# Patient Record
Sex: Male | Born: 1945 | Race: White | Hispanic: No | Marital: Married | Smoking: Former smoker
Health system: Southern US, Community
[De-identification: ages and names within clinical notes are randomized; demographics above are authoritative.]

## PROBLEM LIST (undated history)

## (undated) DIAGNOSIS — M1990 Unspecified osteoarthritis, unspecified site: Secondary | ICD-10-CM

## (undated) DIAGNOSIS — I10 Essential (primary) hypertension: Secondary | ICD-10-CM

## (undated) DIAGNOSIS — K219 Gastro-esophageal reflux disease without esophagitis: Secondary | ICD-10-CM

## (undated) DIAGNOSIS — E663 Overweight: Secondary | ICD-10-CM

## (undated) DIAGNOSIS — R079 Chest pain, unspecified: Principal | ICD-10-CM

## (undated) DIAGNOSIS — R410 Disorientation, unspecified: Secondary | ICD-10-CM

## (undated) DIAGNOSIS — R195 Other fecal abnormalities: Secondary | ICD-10-CM

## (undated) DIAGNOSIS — R0600 Dyspnea, unspecified: Secondary | ICD-10-CM

## (undated) DIAGNOSIS — E785 Hyperlipidemia, unspecified: Secondary | ICD-10-CM

## (undated) DIAGNOSIS — R519 Headache, unspecified: Secondary | ICD-10-CM

## (undated) HISTORY — DX: Overweight: E66.3

## (undated) HISTORY — DX: Disorientation, unspecified: R41.0

## (undated) HISTORY — DX: Chest pain, unspecified: R07.9

## (undated) HISTORY — PX: CYST EXCISION: SHX5701

## (undated) HISTORY — PX: OTHER SURGICAL HISTORY: SHX169

## (undated) HISTORY — DX: Essential (primary) hypertension: I10

## (undated) HISTORY — PX: TONSILLECTOMY: SUR1361

## (undated) HISTORY — DX: Hyperlipidemia, unspecified: E78.5

## (undated) HISTORY — DX: Gastro-esophageal reflux disease without esophagitis: K21.9

## (undated) HISTORY — DX: Other fecal abnormalities: R19.5

---

## 1975-06-25 HISTORY — PX: EXPLORATORY LAPAROTOMY: SUR591

## 2012-06-26 DIAGNOSIS — E785 Hyperlipidemia, unspecified: Secondary | ICD-10-CM | POA: Diagnosis not present

## 2012-06-26 DIAGNOSIS — G459 Transient cerebral ischemic attack, unspecified: Secondary | ICD-10-CM | POA: Diagnosis not present

## 2012-06-26 DIAGNOSIS — I10 Essential (primary) hypertension: Secondary | ICD-10-CM | POA: Diagnosis not present

## 2012-06-26 DIAGNOSIS — Z125 Encounter for screening for malignant neoplasm of prostate: Secondary | ICD-10-CM | POA: Diagnosis not present

## 2012-06-26 DIAGNOSIS — Z0000 Encounter for general adult medical examination without abnormal findings: Secondary | ICD-10-CM | POA: Diagnosis not present

## 2012-06-26 DIAGNOSIS — R7989 Other specified abnormal findings of blood chemistry: Secondary | ICD-10-CM | POA: Diagnosis not present

## 2012-07-01 DIAGNOSIS — Z0000 Encounter for general adult medical examination without abnormal findings: Secondary | ICD-10-CM | POA: Diagnosis not present

## 2012-07-06 ENCOUNTER — Encounter: Payer: Self-pay | Admitting: *Deleted

## 2012-07-07 ENCOUNTER — Ambulatory Visit (INDEPENDENT_AMBULATORY_CARE_PROVIDER_SITE_OTHER): Payer: Medicare Other | Admitting: Cardiology

## 2012-07-07 ENCOUNTER — Ambulatory Visit: Payer: Self-pay | Admitting: Cardiology

## 2012-07-07 ENCOUNTER — Encounter: Payer: Self-pay | Admitting: Cardiology

## 2012-07-07 ENCOUNTER — Encounter: Payer: Self-pay | Admitting: *Deleted

## 2012-07-07 VITALS — BP 128/88 | HR 74 | Ht 67.0 in | Wt 199.0 lb

## 2012-07-07 DIAGNOSIS — I10 Essential (primary) hypertension: Secondary | ICD-10-CM

## 2012-07-07 DIAGNOSIS — E785 Hyperlipidemia, unspecified: Secondary | ICD-10-CM | POA: Insufficient documentation

## 2012-07-07 DIAGNOSIS — R079 Chest pain, unspecified: Secondary | ICD-10-CM

## 2012-07-07 NOTE — Assessment & Plan Note (Addendum)
Moderate hyperlipidemia with other cardiovascular risk factors. Treatment with a statin is appropriate and is being monitored by PCP.

## 2012-07-07 NOTE — Assessment & Plan Note (Signed)
Chest discomfort is atypical, but patient has significant cardiovascular risk factors and wishes to become more physically active. Stress testing is clearly warranted in this setting.

## 2012-07-07 NOTE — Assessment & Plan Note (Addendum)
Blood pressure control has been fairly good, but is minimally elevated at this visit. Patient might benefit from further evaluation and treatment of possible sleep apnea. This issue will be left to PCP's discretion.

## 2012-07-07 NOTE — Patient Instructions (Addendum)
Your physician recommends that you schedule a follow-up appointment in: AS NEEDED  TREADMILL STRESS TEST
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
## 2012-07-07 NOTE — Progress Notes (Deleted)
Name: Joseph Dillon    DOB: Aug 23, 1945  Age: 67 y.o.  MR#: [id number]       PCP:  Frazier Richards, PA-C      Insurance: @PAYORNAME @   CC:   No chief complaint on file.  MEDICATION LIST - HAS NOT STARTED ON PRAVASTATIN.  AWAITING ON MAIL ORDER.  VS BP 128/88  Pulse 74  Ht 5\' 7"  (1.702 m)  Wt 199 lb (90.266 kg)  BMI 31.17 kg/m2  SpO2 98%  Weights Current Weight  07/07/12 199 lb (90.266 kg)    Blood Pressure  BP Readings from Last 3 Encounters:  07/07/12 128/88     Admit date:  (Not on file) Last encounter with RMR:  Visit date not found   Allergy Allergies  Allergen Reactions  . Lisinopril Cough  . Penicillins     Current Outpatient Prescriptions  Medication Sig Dispense Refill  . amLODipine (NORVASC) 10 MG tablet Take 10 mg by mouth daily.      . fish oil-omega-3 fatty acids 1000 MG capsule Take 2 g by mouth daily.      . hydrochlorothiazide (HYDRODIURIL) 25 MG tablet Take 25 mg by mouth daily.      Marland Kitchen losartan (COZAAR) 50 MG tablet Take 50 mg by mouth daily.      . magnesium oxide (MAG-OX) 400 MG tablet Take 400 mg by mouth daily.      . pravastatin (PRAVACHOL) 40 MG tablet Take 40 mg by mouth daily.        Discontinued Meds:   There are no discontinued medications.  There is no problem list on file for this patient.   LABS No results found for any previous visit.   Results for this Opt Visit:    No results found for this or any previous visit.  EKG No orders found for this or any previous visit.   Prior Assessment and Plan    Imaging: No results found.   FRS Calculation: Score not calculated. Missing: Total Cholesterol, HDL

## 2012-07-08 ENCOUNTER — Encounter: Payer: Self-pay | Admitting: Cardiology

## 2012-07-09 ENCOUNTER — Encounter (HOSPITAL_COMMUNITY): Payer: Self-pay | Admitting: Cardiology

## 2012-07-09 ENCOUNTER — Ambulatory Visit (HOSPITAL_COMMUNITY)
Admission: RE | Admit: 2012-07-09 | Discharge: 2012-07-09 | Disposition: A | Discharge status: Home / Self Care | Payer: Medicare Other | Source: Ambulatory Visit | Attending: Physician Assistant | Admitting: Physician Assistant

## 2012-07-09 DIAGNOSIS — R079 Chest pain, unspecified: Secondary | ICD-10-CM | POA: Diagnosis not present

## 2012-07-09 DIAGNOSIS — I10 Essential (primary) hypertension: Secondary | ICD-10-CM | POA: Diagnosis not present

## 2012-07-09 NOTE — Progress Notes (Addendum)
Stress Lab Nurses Notes - Joseph Dillon  Joseph Dillon 07/09/2012  Reason for doing test: Chest Pain  Type of test: Regular GTX  Nurse performing test: Joseph Clipper, RN  Nuclear Medicine Tech: Not Applicable  Echo Tech: Not Applicable  MD performing test: Joseph Reining NP  Family MD:   Test explained and consent signed: yes  IV started: No IV started  Symptoms: fatigue and sob  Treatment/Intervention: None  Reason test stopped: fatigue, reached target HR and SOB  After recovery IV was: No IV started  Patient to return to Nuc. Med at :N/A  Patient discharged: Home  Patient's Condition upon discharge was: stable  Comments: Patient walked 10.37 minutes. His resting Hr was 85 and His BP was 136/97 his peak Hr was 155 and peak BP was 186/97. Symptoms resolved in recovery.  Joseph Dillon  Graded Exercise Test-Interpretation      Graded exercise performed to a work load of 13.4 METs and a heart rate of 157, 100% of age-predicted maximum.  Exercise discontinued due to dyspnea and fatigue; no chest discomfort reported.  Blood pressure increased from a resting value of 135/95 to 170/100 at peak exercise.  No arrhythmias noted.  EKG: Normal sinus rhythm; left anterior fascicular block; delayed R-wave progression. Stress EKG:  No significant change Impression:  Negative and adequate graded exercise test.  Other findings as noted.  Joseph Dillon, M.D.

## 2012-07-16 ENCOUNTER — Telehealth: Payer: Self-pay | Admitting: *Deleted

## 2012-07-16 NOTE — Telephone Encounter (Signed)
Patient made aware of negative GXT results

## 2013-03-30 DIAGNOSIS — Z23 Encounter for immunization: Secondary | ICD-10-CM | POA: Diagnosis not present

## 2013-05-22 ENCOUNTER — Other Ambulatory Visit: Payer: Self-pay | Admitting: Family Medicine

## 2013-05-24 ENCOUNTER — Encounter: Payer: Self-pay | Admitting: Family Medicine

## 2013-05-24 NOTE — Telephone Encounter (Signed)
Medication refill for one time only.  Patient needs to be seen.  Letter sent for patient to call and schedule 

## 2013-08-12 ENCOUNTER — Other Ambulatory Visit: Payer: Medicare Other

## 2013-08-17 ENCOUNTER — Encounter: Payer: Self-pay | Admitting: Family Medicine

## 2013-08-24 ENCOUNTER — Other Ambulatory Visit: Payer: Medicare Other

## 2013-08-24 ENCOUNTER — Other Ambulatory Visit: Payer: Self-pay | Admitting: Family Medicine

## 2013-08-24 DIAGNOSIS — E785 Hyperlipidemia, unspecified: Secondary | ICD-10-CM | POA: Diagnosis not present

## 2013-08-24 DIAGNOSIS — Z79899 Other long term (current) drug therapy: Secondary | ICD-10-CM | POA: Diagnosis not present

## 2013-08-24 DIAGNOSIS — Z0000 Encounter for general adult medical examination without abnormal findings: Secondary | ICD-10-CM | POA: Diagnosis not present

## 2013-08-24 DIAGNOSIS — R7401 Elevation of levels of liver transaminase levels: Secondary | ICD-10-CM | POA: Diagnosis not present

## 2013-08-24 DIAGNOSIS — Z125 Encounter for screening for malignant neoplasm of prostate: Secondary | ICD-10-CM | POA: Diagnosis not present

## 2013-08-24 DIAGNOSIS — I10 Essential (primary) hypertension: Secondary | ICD-10-CM | POA: Diagnosis not present

## 2013-08-24 DIAGNOSIS — R7402 Elevation of levels of lactic acid dehydrogenase (LDH): Secondary | ICD-10-CM | POA: Diagnosis not present

## 2013-08-24 LAB — COMPLETE METABOLIC PANEL WITH GFR
ALT: 71 U/L — ABNORMAL HIGH (ref 0–53)
AST: 31 U/L (ref 0–37)
Albumin: 4.7 g/dL (ref 3.5–5.2)
Alkaline Phosphatase: 83 U/L (ref 39–117)
BUN: 21 mg/dL (ref 6–23)
CALCIUM: 10.6 mg/dL — AB (ref 8.4–10.5)
CHLORIDE: 97 meq/L (ref 96–112)
CO2: 30 mEq/L (ref 19–32)
Creat: 1.23 mg/dL (ref 0.50–1.35)
GFR, EST NON AFRICAN AMERICAN: 61 mL/min
GFR, Est African American: 70 mL/min
GLUCOSE: 102 mg/dL — AB (ref 70–99)
POTASSIUM: 4.3 meq/L (ref 3.5–5.3)
Sodium: 138 mEq/L (ref 135–145)
Total Bilirubin: 0.7 mg/dL (ref 0.2–1.2)
Total Protein: 7.4 g/dL (ref 6.0–8.3)

## 2013-08-24 LAB — CBC WITH DIFFERENTIAL/PLATELET
BASOS PCT: 0 % (ref 0–1)
Basophils Absolute: 0 10*3/uL (ref 0.0–0.1)
Eosinophils Absolute: 0.1 10*3/uL (ref 0.0–0.7)
Eosinophils Relative: 1 % (ref 0–5)
HEMATOCRIT: 45.1 % (ref 39.0–52.0)
HEMOGLOBIN: 16 g/dL (ref 13.0–17.0)
LYMPHS PCT: 36 % (ref 12–46)
Lymphs Abs: 2.8 10*3/uL (ref 0.7–4.0)
MCH: 30.1 pg (ref 26.0–34.0)
MCHC: 35.5 g/dL (ref 30.0–36.0)
MCV: 84.9 fL (ref 78.0–100.0)
MONO ABS: 0.5 10*3/uL (ref 0.1–1.0)
MONOS PCT: 6 % (ref 3–12)
Neutro Abs: 4.4 10*3/uL (ref 1.7–7.7)
Neutrophils Relative %: 57 % (ref 43–77)
Platelets: 257 10*3/uL (ref 150–400)
RBC: 5.31 MIL/uL (ref 4.22–5.81)
RDW: 14.2 % (ref 11.5–15.5)
WBC: 7.7 10*3/uL (ref 4.0–10.5)

## 2013-08-24 LAB — LIPID PANEL
CHOLESTEROL: 272 mg/dL — AB (ref 0–200)
HDL: 55 mg/dL (ref >39)
LDL Cholesterol: 176 mg/dL — ABNORMAL HIGH (ref 0–99)
Total CHOL/HDL Ratio: 4.9 Ratio
Triglycerides: 204 mg/dL — ABNORMAL HIGH (ref <150)
VLDL: 41 mg/dL — ABNORMAL HIGH (ref 0–40)

## 2013-08-24 LAB — TSH: TSH: 1.779 u[IU]/mL (ref 0.350–4.500)

## 2013-08-30 ENCOUNTER — Other Ambulatory Visit: Payer: Self-pay | Admitting: Physician Assistant

## 2013-08-30 DIAGNOSIS — Z1211 Encounter for screening for malignant neoplasm of colon: Secondary | ICD-10-CM

## 2013-08-31 ENCOUNTER — Ambulatory Visit (INDEPENDENT_AMBULATORY_CARE_PROVIDER_SITE_OTHER): Payer: Medicare Other | Admitting: Family Medicine

## 2013-08-31 ENCOUNTER — Encounter: Payer: Self-pay | Admitting: Family Medicine

## 2013-08-31 VITALS — BP 130/70 | HR 78 | Temp 98.8°F | Resp 16 | Ht 67.0 in | Wt 211.0 lb

## 2013-08-31 DIAGNOSIS — Z0000 Encounter for general adult medical examination without abnormal findings: Secondary | ICD-10-CM

## 2013-08-31 DIAGNOSIS — R7989 Other specified abnormal findings of blood chemistry: Secondary | ICD-10-CM

## 2013-08-31 DIAGNOSIS — R945 Abnormal results of liver function studies: Secondary | ICD-10-CM

## 2013-08-31 DIAGNOSIS — R053 Chronic cough: Secondary | ICD-10-CM

## 2013-08-31 DIAGNOSIS — Z125 Encounter for screening for malignant neoplasm of prostate: Secondary | ICD-10-CM

## 2013-08-31 DIAGNOSIS — E669 Obesity, unspecified: Secondary | ICD-10-CM | POA: Diagnosis not present

## 2013-08-31 DIAGNOSIS — R059 Cough, unspecified: Secondary | ICD-10-CM

## 2013-08-31 DIAGNOSIS — Z23 Encounter for immunization: Secondary | ICD-10-CM

## 2013-08-31 DIAGNOSIS — R05 Cough: Secondary | ICD-10-CM | POA: Diagnosis not present

## 2013-08-31 LAB — HEPATITIS PANEL, ACUTE
HCV AB: NEGATIVE
HEP B C IGM: NONREACTIVE
Hep A IgM: NONREACTIVE
Hepatitis B Surface Ag: NEGATIVE

## 2013-08-31 LAB — PSA, MEDICARE: PSA: 2.67 ng/mL (ref <=4.00)

## 2013-08-31 MED ORDER — DOXAZOSIN MESYLATE 4 MG PO TABS: 4.0000 mg | ORAL_TABLET | Freq: Every day | ORAL | Status: DC

## 2013-08-31 MED ORDER — TOPIRAMATE 25 MG PO TABS: 25.0000 mg | ORAL_TABLET | Freq: Two times a day (BID) | ORAL | Status: DC

## 2013-08-31 MED ORDER — OMEPRAZOLE 40 MG PO CPDR: 40.0000 mg | DELAYED_RELEASE_CAPSULE | Freq: Every day | ORAL | Status: DC
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
## 2013-08-31 NOTE — Addendum Note (Signed)
Addended by: Shary Decamp B on: 08/31/2013 10:47 AM   Modules accepted: Orders

## 2013-08-31 NOTE — Addendum Note (Signed)
Addended by: WRAY, [place] on: 08/31/2013 10:05 AM   Modules accepted: Orders

## 2013-09-03 ENCOUNTER — Telehealth: Payer: Self-pay | Admitting: *Deleted

## 2013-09-03 DIAGNOSIS — E669 Obesity, unspecified: Secondary | ICD-10-CM

## 2013-09-03 MED ORDER — ATORVASTATIN CALCIUM 10 MG PO TABS: 10.0000 mg | ORAL_TABLET | Freq: Every day | ORAL | Status: DC

## 2013-09-03 MED ORDER — TOPIRAMATE 25 MG PO TABS: 25.0000 mg | ORAL_TABLET | Freq: Two times a day (BID) | ORAL | Status: DC

## 2013-09-03 NOTE — Telephone Encounter (Signed)
Call placed to patient and patient made aware.   Prescription sent to pharmacy.  

## 2013-09-03 NOTE — Telephone Encounter (Signed)
Message copied by Sheral Flow on Fri Sep 03, 2013  9:05 AM ------      Message from: Jenna Luo      Created: Thu Sep 02, 2013  7:23 AM       There is no sign of viral hepatitis. He most likely has fatty liver disease. Okay with him starting Topamax. I also recommend Lipitor 10 mg by mouth daily for his elevated cholesterol. Recheck labs in 3 months. ------

## 2013-09-13 ENCOUNTER — Telehealth: Payer: Self-pay | Admitting: Physician Assistant

## 2013-09-13 MED ORDER — HYDROCHLOROTHIAZIDE 25 MG PO TABS: 25.0000 mg | ORAL_TABLET | Freq: Every day | ORAL | Status: DC

## 2013-09-13 MED ORDER — LOSARTAN POTASSIUM 50 MG PO TABS: 50.0000 mg | ORAL_TABLET | Freq: Every day | ORAL | Status: DC

## 2013-09-13 MED ORDER — DOXAZOSIN MESYLATE 4 MG PO TABS: 4.0000 mg | ORAL_TABLET | Freq: Every day | ORAL | Status: DC

## 2013-09-13 NOTE — Telephone Encounter (Signed)
Call back number is [phone] [facility] Pt is needing a refill on 3 bp medications.

## 2013-09-13 NOTE — Telephone Encounter (Signed)
Medication refilled per protocol. 

## 2013-09-19 ENCOUNTER — Other Ambulatory Visit: Payer: Self-pay | Admitting: Physician Assistant

## 2013-09-20 NOTE — Telephone Encounter (Signed)
Medication refilled per protocol. 

## 2013-10-06 ENCOUNTER — Telehealth: Payer: Self-pay

## 2013-10-06 NOTE — Telephone Encounter (Signed)
LMOM to call back

## 2013-10-06 NOTE — Telephone Encounter (Signed)
Gastroenterology Pre-Procedure Review  Request Date: Requesting Physician: PICKARD  PATIENT REVIEW QUESTIONS: The patient responded to the following health history questions as indicated:    1. Diabetes Melitis:  2. Joint replacements in the past 12 months:  3. Major health problems in the past 3 months:  4. Has an artificial valve or MVP:  5. Has a defibrillator:  6. Has been advised in past to take antibiotics in advance of a procedure like teeth cleaning:  7. Acholic: 8 Family history colon cancer:    MEDICATIONS & ALLERGIES:    Patient reports the following regarding taking any blood thinners:   Plavix?  Aspirin?  Coumadin?   Patient confirms/reports the following medications:  Current Outpatient Prescriptions  Medication Sig Dispense Refill  . amLODipine (NORVASC) 10 MG tablet Take 1 tablet (10 mg total) by mouth daily.  90 tablet  1  . aspirin 325 MG tablet Take 325 mg by mouth daily.      Marland Kitchen atorvastatin (LIPITOR) 10 MG tablet Take 1 tablet (10 mg total) by mouth daily.  90 tablet  3  . doxazosin (CARDURA) 4 MG tablet Take 1 tablet (4 mg total) by mouth daily.  90 tablet  1  . hydrochlorothiazide (HYDRODIURIL) 25 MG tablet Take 1 tablet (25 mg total) by mouth daily.  90 tablet  1  . losartan (COZAAR) 50 MG tablet Take 1 tablet (50 mg total) by mouth daily.  90 tablet  1  . omeprazole (PRILOSEC) 40 MG capsule Take 1 capsule (40 mg total) by mouth daily.  30 capsule  3  . topiramate (TOPAMAX) 25 MG tablet Take 1 tablet (25 mg total) by mouth 2 (two) times daily.  180 tablet  0   No current facility-administered medications for this visit.    Patient confirms/reports the following allergies:  Allergies  Allergen Reactions  . Lisinopril Cough  . Penicillins     No orders of the defined types were placed in this encounter.    AUTHORIZATION INFORMATION Primary Insurance: ,  ID #:   Group #:  Pre-Cert / Auth required:  Pre-Cert / Auth #:   Secondary Insurance:   ID  #:   Group #:  Pre-Cert / Auth required:  Pre-Cert / Auth #:   SCHEDULE INFORMATION: Procedure has been scheduled as follows:  Date:  Time:  Location:  This Gastroenterology Pre-Precedure Review Form is being routed to the following provider(s):

## 2013-11-17 NOTE — Telephone Encounter (Signed)
LMOM to call.

## 2013-12-30 ENCOUNTER — Telehealth: Payer: Self-pay

## 2013-12-30 NOTE — Telephone Encounter (Signed)
Pt was referred by [facility] for screening colonoscopy. Called and [facility] for a return call.

## 2014-01-25 NOTE — Telephone Encounter (Signed)
LMOM for a return call.  

## 2014-02-11 ENCOUNTER — Other Ambulatory Visit: Payer: Self-pay | Admitting: Family Medicine

## 2014-02-11 NOTE — Telephone Encounter (Signed)
Refill appropriate and filled per protocol. 

## 2014-02-25 ENCOUNTER — Other Ambulatory Visit: Payer: Self-pay | Admitting: Family Medicine

## 2014-02-27 ENCOUNTER — Other Ambulatory Visit: Payer: Self-pay | Admitting: Family Medicine

## 2014-04-05 DIAGNOSIS — Z23 Encounter for immunization: Secondary | ICD-10-CM | POA: Diagnosis not present

## 2014-04-24 ENCOUNTER — Other Ambulatory Visit: Payer: Self-pay | Admitting: Family Medicine

## 2014-04-25 NOTE — Telephone Encounter (Signed)
Medication filled x1 with no refills.   Requires office visit before any further refills can be given.   Letter sent.  

## 2014-05-08 ENCOUNTER — Other Ambulatory Visit: Payer: Self-pay | Admitting: Family Medicine

## 2014-05-12 ENCOUNTER — Encounter: Payer: Self-pay | Admitting: Family Medicine

## 2014-05-12 ENCOUNTER — Other Ambulatory Visit: Payer: Self-pay | Admitting: Family Medicine

## 2014-05-12 NOTE — Telephone Encounter (Signed)
will send letter for ov and labs

## 2014-06-28 ENCOUNTER — Encounter: Payer: Self-pay | Admitting: Family Medicine

## 2014-06-28 ENCOUNTER — Ambulatory Visit (INDEPENDENT_AMBULATORY_CARE_PROVIDER_SITE_OTHER): Payer: Medicare Other | Admitting: Family Medicine

## 2014-06-28 VITALS — BP 118/70 | HR 72 | Temp 98.3°F | Resp 18 | Wt 206.0 lb

## 2014-06-28 DIAGNOSIS — I10 Essential (primary) hypertension: Secondary | ICD-10-CM

## 2014-06-28 LAB — COMPLETE METABOLIC PANEL WITH GFR
ALBUMIN: 4.3 g/dL (ref 3.5–5.2)
ALK PHOS: 75 U/L (ref 39–117)
ALT: 30 U/L (ref 0–53)
AST: 24 U/L (ref 0–37)
BUN: 20 mg/dL (ref 6–23)
CO2: 30 mEq/L (ref 19–32)
Calcium: 9.4 mg/dL (ref 8.4–10.5)
Chloride: 99 mEq/L (ref 96–112)
Creat: 1.18 mg/dL (ref 0.50–1.35)
GFR, EST AFRICAN AMERICAN: 74 mL/min
GFR, EST NON AFRICAN AMERICAN: 64 mL/min
Glucose, Bld: 103 mg/dL — ABNORMAL HIGH (ref 70–99)
POTASSIUM: 4.9 meq/L (ref 3.5–5.3)
SODIUM: 140 meq/L (ref 135–145)
TOTAL PROTEIN: 6.6 g/dL (ref 6.0–8.3)
Total Bilirubin: 0.6 mg/dL (ref 0.2–1.2)

## 2014-06-28 LAB — LIPID PANEL
CHOLESTEROL: 244 mg/dL — AB (ref 0–200)
HDL: 49 mg/dL (ref >39)
LDL Cholesterol: 172 mg/dL — ABNORMAL HIGH (ref 0–99)
TRIGLYCERIDES: 116 mg/dL (ref <150)
Total CHOL/HDL Ratio: 5 Ratio
VLDL: 23 mg/dL (ref 0–40)
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
## 2014-07-06 ENCOUNTER — Other Ambulatory Visit: Payer: Self-pay | Admitting: Family Medicine

## 2014-07-06 NOTE — Telephone Encounter (Signed)
Medication refilled per protocol. 

## 2014-07-12 ENCOUNTER — Telehealth: Payer: Self-pay | Admitting: Family Medicine

## 2014-07-12 MED ORDER — PRAVASTATIN SODIUM 40 MG PO TABS: 40.0000 mg | ORAL_TABLET | Freq: Every day | ORAL | Status: DC

## 2014-07-12 NOTE — Telephone Encounter (Signed)
-----   Message from Susy Frizzle, MD sent at 06/30/2014  7:27 AM EST ----- ldl is bad. Start lipitor or pravastatin 40 mg poqday and recheck in 3 months.

## 2014-07-12 NOTE — Telephone Encounter (Signed)
Spoke to wife about lab results. RX to pharmacy.  Needs three month follow up

## 2014-07-23 ENCOUNTER — Other Ambulatory Visit: Payer: Self-pay | Admitting: Family Medicine

## 2014-08-30 ENCOUNTER — Encounter: Payer: Self-pay | Admitting: Family Medicine

## 2014-08-30 ENCOUNTER — Ambulatory Visit (INDEPENDENT_AMBULATORY_CARE_PROVIDER_SITE_OTHER): Payer: BLUE CROSS/BLUE SHIELD | Admitting: Family Medicine

## 2014-08-30 VITALS — BP 122/74 | HR 62 | Temp 98.2°F | Resp 16 | Ht 67.0 in | Wt 217.0 lb

## 2014-08-30 DIAGNOSIS — K219 Gastro-esophageal reflux disease without esophagitis: Secondary | ICD-10-CM | POA: Diagnosis not present

## 2014-08-30 DIAGNOSIS — Z125 Encounter for screening for malignant neoplasm of prostate: Secondary | ICD-10-CM

## 2014-08-30 DIAGNOSIS — E785 Hyperlipidemia, unspecified: Secondary | ICD-10-CM

## 2014-08-30 DIAGNOSIS — Z0000 Encounter for general adult medical examination without abnormal findings: Secondary | ICD-10-CM

## 2014-08-30 MED ORDER — PANTOPRAZOLE SODIUM 40 MG PO TBEC: 40.0000 mg | DELAYED_RELEASE_TABLET | Freq: Every day | ORAL | Status: DC
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
## 2014-09-15 ENCOUNTER — Other Ambulatory Visit: Payer: Self-pay | Admitting: Family Medicine

## 2014-09-15 NOTE — Telephone Encounter (Signed)
Refill appropriate and filled per protocol. 

## 2014-09-20 ENCOUNTER — Other Ambulatory Visit: Payer: Self-pay | Admitting: Family Medicine

## 2014-09-20 ENCOUNTER — Encounter: Payer: Self-pay | Admitting: Family Medicine

## 2014-09-20 NOTE — Telephone Encounter (Signed)
Medication refill for one time only.  Patient needs to be seen.  Letter sent for patient to call and schedule 

## 2014-09-30 ENCOUNTER — Other Ambulatory Visit: Payer: Self-pay | Admitting: Family Medicine

## 2014-10-12 ENCOUNTER — Other Ambulatory Visit: Payer: Medicare Other

## 2014-10-18 ENCOUNTER — Other Ambulatory Visit: Payer: Self-pay | Admitting: Family Medicine

## 2014-10-18 ENCOUNTER — Other Ambulatory Visit: Payer: Medicare Other

## 2014-10-18 ENCOUNTER — Ambulatory Visit: Payer: Medicare Other | Admitting: Family Medicine

## 2014-10-18 DIAGNOSIS — Z0000 Encounter for general adult medical examination without abnormal findings: Secondary | ICD-10-CM | POA: Diagnosis not present

## 2014-10-18 DIAGNOSIS — E785 Hyperlipidemia, unspecified: Secondary | ICD-10-CM | POA: Diagnosis not present

## 2014-10-19 LAB — COMPLETE METABOLIC PANEL WITH GFR
ALT: 31 U/L (ref 0–53)
AST: 21 U/L (ref 0–37)
Albumin: 4.5 g/dL (ref 3.5–5.2)
Alkaline Phosphatase: 64 U/L (ref 39–117)
BUN: 23 mg/dL (ref 6–23)
CHLORIDE: 103 meq/L (ref 96–112)
CO2: 26 meq/L (ref 19–32)
Calcium: 9.3 mg/dL (ref 8.4–10.5)
Creat: 1.24 mg/dL (ref 0.50–1.35)
GFR, EST AFRICAN AMERICAN: 69 mL/min
GFR, Est Non African American: 60 mL/min
Glucose, Bld: 102 mg/dL — ABNORMAL HIGH (ref 70–99)
POTASSIUM: 4.3 meq/L (ref 3.5–5.3)
SODIUM: 140 meq/L (ref 135–145)
TOTAL PROTEIN: 6.6 g/dL (ref 6.0–8.3)
Total Bilirubin: 0.6 mg/dL (ref 0.2–1.2)

## 2014-10-19 LAB — CBC WITH DIFFERENTIAL/PLATELET
BASOS ABS: 0 10*3/uL (ref 0.0–0.1)
Basophils Relative: 0 % (ref 0–1)
EOS PCT: 1 % (ref 0–5)
Eosinophils Absolute: 0.1 10*3/uL (ref 0.0–0.7)
HEMATOCRIT: 42.6 % (ref 39.0–52.0)
Hemoglobin: 14.5 g/dL (ref 13.0–17.0)
Lymphocytes Relative: 35 % (ref 12–46)
Lymphs Abs: 2.1 10*3/uL (ref 0.7–4.0)
MCH: 29.8 pg (ref 26.0–34.0)
MCHC: 34 g/dL (ref 30.0–36.0)
MCV: 87.7 fL (ref 78.0–100.0)
MPV: 10 fL (ref 8.6–12.4)
Monocytes Absolute: 0.4 10*3/uL (ref 0.1–1.0)
Monocytes Relative: 7 % (ref 3–12)
Neutro Abs: 3.5 10*3/uL (ref 1.7–7.7)
Neutrophils Relative %: 57 % (ref 43–77)
Platelets: 237 10*3/uL (ref 150–400)
RBC: 4.86 MIL/uL (ref 4.22–5.81)
RDW: 14 % (ref 11.5–15.5)
WBC: 6.1 10*3/uL (ref 4.0–10.5)

## 2014-10-19 LAB — LIPID PANEL
Cholesterol: 207 mg/dL — ABNORMAL HIGH (ref 0–200)
HDL: 63 mg/dL (ref >=40)
LDL Cholesterol: 126 mg/dL — ABNORMAL HIGH (ref 0–99)
TRIGLYCERIDES: 89 mg/dL (ref <150)
Total CHOL/HDL Ratio: 3.3 Ratio
VLDL: 18 mg/dL (ref 0–40)

## 2014-10-19 LAB — PSA, MEDICARE: PSA: 2.83 ng/mL (ref <=4.00)

## 2014-10-25 ENCOUNTER — Ambulatory Visit (INDEPENDENT_AMBULATORY_CARE_PROVIDER_SITE_OTHER): Payer: BLUE CROSS/BLUE SHIELD | Admitting: Family Medicine

## 2014-10-25 ENCOUNTER — Encounter: Payer: Self-pay | Admitting: Family Medicine

## 2014-10-25 VITALS — BP 98/62 | HR 78 | Temp 98.8°F | Resp 16 | Ht 67.0 in | Wt 206.0 lb

## 2014-10-25 DIAGNOSIS — E785 Hyperlipidemia, unspecified: Secondary | ICD-10-CM

## 2014-10-25 DIAGNOSIS — I10 Essential (primary) hypertension: Secondary | ICD-10-CM

## 2014-10-25 DIAGNOSIS — Z23 Encounter for immunization: Secondary | ICD-10-CM

## 2014-10-25 NOTE — Addendum Note (Signed)
Addended by: Shary Decamp B on: 10/25/2014 10:06 AM   Modules accepted: Orders
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
## 2014-10-28 ENCOUNTER — Other Ambulatory Visit: Payer: Self-pay | Admitting: Family Medicine

## 2014-10-28 MED ORDER — PRAVASTATIN SODIUM 40 MG PO TABS: 40.0000 mg | ORAL_TABLET | Freq: Every day | ORAL | Status: DC

## 2014-10-28 NOTE — Telephone Encounter (Signed)
Pravastatin refilled for 6 months at pharmacy of choice

## 2014-11-02 ENCOUNTER — Telehealth: Payer: Self-pay | Admitting: Physician Assistant

## 2014-11-02 NOTE — Telephone Encounter (Signed)
[phone] ( he can't be reached he said because he won't get home till after 8) Pt called and said that the cholesterol medication he was given by another provider has made him break out in rash/hives and since then he has stopped taking it.

## 2014-11-03 NOTE — Telephone Encounter (Signed)
lmtrc

## 2014-11-03 NOTE — Telephone Encounter (Signed)
I would like him to stop pravastatin and replace with lipitor 20 mg poqday

## 2014-11-17 NOTE — Telephone Encounter (Signed)
LMTRC

## 2014-11-22 ENCOUNTER — Other Ambulatory Visit: Payer: Medicare Other

## 2014-11-22 ENCOUNTER — Ambulatory Visit: Payer: Medicare Other | Admitting: *Deleted

## 2014-11-22 DIAGNOSIS — W57XXXA Bitten or stung by nonvenomous insect and other nonvenomous arthropods, initial encounter: Principal | ICD-10-CM

## 2014-11-22 DIAGNOSIS — S50861A Insect bite (nonvenomous) of right forearm, initial encounter: Secondary | ICD-10-CM

## 2014-11-22 NOTE — Progress Notes (Signed)
[name], male   DOB: 1945/09/16, 69 y.o.   MRN: [id number] Pt came in for nurse visit to have someone to look at his tick bite that happened 4 weeks ago says he was fine and now he is developing fatique and headache with a rash on his upper extremities. I offered appt to pt for today and states that he has to be at work and could not be seen, then I offered 9:30 am with [employer] and she states at 930am he will be at work and had requested to be seen first thing in the am. I informed pt this 930 am is the earliest I can schedule him with this provider. He was firm and stated that he needed to be seen because his symptoms are getting worse and would like to just get an antibiotic. I went ahead and double booked for 8am with MaryBeth to please pt.

## 2014-11-23 ENCOUNTER — Ambulatory Visit (INDEPENDENT_AMBULATORY_CARE_PROVIDER_SITE_OTHER): Payer: BLUE CROSS/BLUE SHIELD | Admitting: Physician Assistant

## 2014-11-23 ENCOUNTER — Encounter: Payer: Self-pay | Admitting: Physician Assistant

## 2014-11-23 VITALS — BP 120/80 | HR 74 | Temp 98.4°F | Resp 19 | Wt 205.0 lb

## 2014-11-23 DIAGNOSIS — R5383 Other fatigue: Secondary | ICD-10-CM | POA: Diagnosis not present

## 2014-11-23 DIAGNOSIS — R519 Headache, unspecified: Secondary | ICD-10-CM

## 2014-11-23 DIAGNOSIS — T148 Other injury of unspecified body region: Secondary | ICD-10-CM

## 2014-11-23 DIAGNOSIS — R51 Headache: Secondary | ICD-10-CM | POA: Diagnosis not present

## 2014-11-23 DIAGNOSIS — W57XXXA Bitten or stung by nonvenomous insect and other nonvenomous arthropods, initial encounter: Secondary | ICD-10-CM

## 2014-11-23 LAB — FECAL OCCULT BLOOD, IMMUNOCHEMICAL: FECAL OCCULT BLOOD: POSITIVE — AB

## 2014-11-23 MED ORDER — DOXYCYCLINE HYCLATE 100 MG PO TABS: 100.0000 mg | ORAL_TABLET | Freq: Two times a day (BID) | ORAL | Status: DC
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
## 2014-11-24 ENCOUNTER — Telehealth: Payer: Self-pay | Admitting: Physician Assistant

## 2014-11-24 NOTE — Telephone Encounter (Signed)
If pt calls back there is lab results and a note in my box about his cholesterol medication as well

## 2014-11-24 NOTE — Telephone Encounter (Signed)
Patient calling regarding the doxycycline - too expensive, wants to try something else [phone]

## 2014-11-24 NOTE — Telephone Encounter (Signed)
Call placed to patient. LMTRC.  

## 2014-11-25 MED ORDER — DOXAZOSIN MESYLATE 4 MG PO TABS: ORAL_TABLET | ORAL | Status: DC

## 2014-11-25 NOTE — Telephone Encounter (Signed)
There is no alternative to doxycycline for tick borne illness that would be safe or cheaper.

## 2014-11-25 NOTE — Telephone Encounter (Signed)
Left voice message statng the message below.  cardura refilled

## 2014-11-25 NOTE — Telephone Encounter (Signed)
Pt wants to know if you could give him an alternative antibiotic that would be cheaper for him.MD please advse!  Pt also wants refill on Cardura.

## 2014-12-01 NOTE — Telephone Encounter (Signed)
Call placed to patient. LMTRC.  

## 2014-12-07 ENCOUNTER — Telehealth: Payer: Self-pay | Admitting: Physician Assistant

## 2014-12-07 NOTE — Telephone Encounter (Signed)
This was refilled on 11/25/14 at [facility]

## 2014-12-07 NOTE — Telephone Encounter (Signed)
[facility] [place] patient is calling to get refill on his doxazosin he has never had it filled at [facility]

## 2014-12-22 ENCOUNTER — Telehealth: Payer: Self-pay | Admitting: Physician Assistant

## 2014-12-22 MED ORDER — LOSARTAN POTASSIUM 50 MG PO TABS: 50.0000 mg | ORAL_TABLET | Freq: Every day | ORAL | Status: DC

## 2014-12-22 MED ORDER — AMLODIPINE BESYLATE 10 MG PO TABS: 10.0000 mg | ORAL_TABLET | Freq: Every day | ORAL | Status: DC

## 2014-12-22 NOTE — Telephone Encounter (Signed)
Medication refilled per protocol. 

## 2014-12-22 NOTE — Telephone Encounter (Signed)
[place] is needing refills on losartan (COZAAR) 50 MG tablet and amLODipine (NORVASC) 10 MG tablet

## 2015-04-19 DIAGNOSIS — Z23 Encounter for immunization: Secondary | ICD-10-CM | POA: Diagnosis not present

## 2015-04-24 ENCOUNTER — Telehealth: Payer: Self-pay | Admitting: Physician Assistant

## 2015-04-24 MED ORDER — HYDROCHLOROTHIAZIDE 25 MG PO TABS
ORAL_TABLET | ORAL | Status: DC
Start: 2015-04-24 — End: 2015-09-26

## 2015-04-24 NOTE — Telephone Encounter (Signed)
PHARMACY: [facility] [place]   MEDICATION: HYDROCHLOROTHIAZIDE   QTY:    SIG:    PHYSICIAN: DIXON   PT. PHONE #: [phone]

## 2015-04-24 NOTE — Telephone Encounter (Signed)
Medication refilled per protocol. 

## 2015-06-28 ENCOUNTER — Telehealth: Payer: Self-pay | Admitting: *Deleted

## 2015-06-28 MED ORDER — AMLODIPINE BESYLATE 10 MG PO TABS: 10.0000 mg | ORAL_TABLET | Freq: Every day | ORAL | Status: DC

## 2015-06-28 NOTE — Telephone Encounter (Signed)
Pt called need refills on Amlodipine, pt has changed pharmacy to [place] [facility]   Call back [phone]

## 2015-06-28 NOTE — Telephone Encounter (Signed)
Medication refilled per protocol. 

## 2015-07-07 ENCOUNTER — Other Ambulatory Visit: Payer: Self-pay | Admitting: Family Medicine

## 2015-07-07 MED ORDER — LOSARTAN POTASSIUM 50 MG PO TABS: 50.0000 mg | ORAL_TABLET | Freq: Every day | ORAL | Status: DC

## 2015-07-07 NOTE — Telephone Encounter (Signed)
Medication refilled per protocol. 

## 2015-07-07 NOTE — Telephone Encounter (Addendum)
Pt is calling to request that we call in Losartin 50 mg to [facility]. He has changed pharmacies.  You can reach him @ [phone]

## 2015-07-27 ENCOUNTER — Telehealth: Payer: Self-pay | Admitting: Family Medicine

## 2015-07-27 NOTE — Telephone Encounter (Signed)
LMTRC

## 2015-07-27 NOTE — Telephone Encounter (Signed)
Patient is asking for refill on his doxycycline if possible   [phone] (H

## 2015-08-02 NOTE — Telephone Encounter (Signed)
Called spoke to wife and states will leave message with pt to return call

## 2015-08-04 ENCOUNTER — Other Ambulatory Visit: Payer: Self-pay | Admitting: Family Medicine

## 2015-08-04 MED ORDER — DOXAZOSIN MESYLATE 4 MG PO TABS: ORAL_TABLET | ORAL | Status: DC

## 2015-08-04 NOTE — Telephone Encounter (Signed)
Pt called back and stated it was not the doxycyline he needed refilled,it was the Doxazosin (Cardura), pt is scheduled to come in March for OV, will refill for 1 month only until pt is seen in OV, pt is aware needs to keep appt in order to receive further refills  Cardura sent to pharmacy

## 2015-09-14 ENCOUNTER — Other Ambulatory Visit: Payer: Self-pay | Admitting: Family Medicine

## 2015-09-14 DIAGNOSIS — E785 Hyperlipidemia, unspecified: Secondary | ICD-10-CM

## 2015-09-14 DIAGNOSIS — Z0000 Encounter for general adult medical examination without abnormal findings: Secondary | ICD-10-CM

## 2015-09-14 DIAGNOSIS — Z125 Encounter for screening for malignant neoplasm of prostate: Secondary | ICD-10-CM

## 2015-09-14 DIAGNOSIS — Z79899 Other long term (current) drug therapy: Secondary | ICD-10-CM

## 2015-09-14 DIAGNOSIS — I10 Essential (primary) hypertension: Secondary | ICD-10-CM

## 2015-09-15 ENCOUNTER — Other Ambulatory Visit: Payer: Medicare Other

## 2015-09-15 ENCOUNTER — Other Ambulatory Visit: Payer: Self-pay | Admitting: Family Medicine

## 2015-09-15 DIAGNOSIS — Z0000 Encounter for general adult medical examination without abnormal findings: Secondary | ICD-10-CM | POA: Diagnosis not present

## 2015-09-15 DIAGNOSIS — Z125 Encounter for screening for malignant neoplasm of prostate: Secondary | ICD-10-CM

## 2015-09-15 DIAGNOSIS — E785 Hyperlipidemia, unspecified: Secondary | ICD-10-CM

## 2015-09-15 DIAGNOSIS — Z79899 Other long term (current) drug therapy: Secondary | ICD-10-CM

## 2015-09-15 DIAGNOSIS — I10 Essential (primary) hypertension: Secondary | ICD-10-CM

## 2015-09-15 LAB — CBC WITH DIFFERENTIAL/PLATELET
Basophils Absolute: 0 10*3/uL (ref 0.0–0.1)
Basophils Relative: 0 % (ref 0–1)
EOS PCT: 1 % (ref 0–5)
Eosinophils Absolute: 0.1 10*3/uL (ref 0.0–0.7)
HEMATOCRIT: 43.6 % (ref 39.0–52.0)
HEMOGLOBIN: 14.9 g/dL (ref 13.0–17.0)
LYMPHS ABS: 2 10*3/uL (ref 0.7–4.0)
LYMPHS PCT: 34 % (ref 12–46)
MCH: 29.2 pg (ref 26.0–34.0)
MCHC: 34.2 g/dL (ref 30.0–36.0)
MCV: 85.3 fL (ref 78.0–100.0)
MONO ABS: 0.5 10*3/uL (ref 0.1–1.0)
MPV: 10 fL (ref 8.6–12.4)
Monocytes Relative: 8 % (ref 3–12)
NEUTROS ABS: 3.4 10*3/uL (ref 1.7–7.7)
Neutrophils Relative %: 57 % (ref 43–77)
Platelets: 207 10*3/uL (ref 150–400)
RBC: 5.11 MIL/uL (ref 4.22–5.81)
RDW: 14.2 % (ref 11.5–15.5)
WBC: 6 10*3/uL (ref 4.0–10.5)

## 2015-09-15 LAB — LIPID PANEL
Cholesterol: 233 mg/dL — ABNORMAL HIGH (ref 125–200)
HDL: 60 mg/dL (ref >=40)
LDL CALC: 165 mg/dL — AB (ref <130)
Total CHOL/HDL Ratio: 3.9 Ratio (ref <=5.0)
Triglycerides: 38 mg/dL (ref <150)
VLDL: 8 mg/dL (ref <30)

## 2015-09-15 LAB — COMPLETE METABOLIC PANEL WITH GFR
ALBUMIN: 4.3 g/dL (ref 3.6–5.1)
ALK PHOS: 64 U/L (ref 40–115)
ALT: 30 U/L (ref 9–46)
AST: 19 U/L (ref 10–35)
BILIRUBIN TOTAL: 0.5 mg/dL (ref 0.2–1.2)
BUN: 19 mg/dL (ref 7–25)
CALCIUM: 9.6 mg/dL (ref 8.6–10.3)
CO2: 27 mmol/L (ref 20–31)
CREATININE: 1.24 mg/dL (ref 0.70–1.25)
Chloride: 103 mmol/L (ref 98–110)
GFR, Est African American: 69 mL/min (ref >=60)
GFR, Est Non African American: 59 mL/min — ABNORMAL LOW (ref >=60)
GLUCOSE: 108 mg/dL — AB (ref 70–99)
POTASSIUM: 4.3 mmol/L (ref 3.5–5.3)
SODIUM: 140 mmol/L (ref 135–146)
TOTAL PROTEIN: 6.9 g/dL (ref 6.1–8.1)

## 2015-09-15 LAB — TSH: TSH: 2.62 m[IU]/L (ref 0.40–4.50)

## 2015-09-15 NOTE — Telephone Encounter (Signed)
Refill appropriate and filled per protocol. 

## 2015-09-16 LAB — PSA, MEDICARE: PSA: 5.1 ng/mL — ABNORMAL HIGH (ref <=4.00)

## 2015-09-26 ENCOUNTER — Ambulatory Visit (INDEPENDENT_AMBULATORY_CARE_PROVIDER_SITE_OTHER): Payer: Medicare Other | Admitting: Family Medicine

## 2015-09-26 ENCOUNTER — Encounter: Payer: Self-pay | Admitting: Family Medicine

## 2015-09-26 VITALS — BP 118/68 | HR 76 | Temp 98.5°F | Resp 18 | Ht 67.0 in | Wt 212.0 lb

## 2015-09-26 DIAGNOSIS — R972 Elevated prostate specific antigen [PSA]: Secondary | ICD-10-CM

## 2015-09-26 DIAGNOSIS — E785 Hyperlipidemia, unspecified: Secondary | ICD-10-CM

## 2015-09-26 DIAGNOSIS — Z0000 Encounter for general adult medical examination without abnormal findings: Secondary | ICD-10-CM

## 2015-09-26 DIAGNOSIS — I10 Essential (primary) hypertension: Secondary | ICD-10-CM | POA: Diagnosis not present

## 2015-09-26 DIAGNOSIS — M79622 Pain in left upper arm: Secondary | ICD-10-CM

## 2015-09-26 DIAGNOSIS — R05 Cough: Secondary | ICD-10-CM

## 2015-09-26 MED ORDER — PRAVASTATIN SODIUM 40 MG PO TABS: 40.0000 mg | ORAL_TABLET | Freq: Every day | ORAL | Status: DC
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
## 2015-09-28 ENCOUNTER — Encounter: Payer: Medicare Other | Admitting: Family Medicine

## 2015-09-28 ENCOUNTER — Ambulatory Visit (HOSPITAL_COMMUNITY)
Admission: RE | Admit: 2015-09-28 | Discharge: 2015-09-28 | Disposition: A | Discharge status: Home / Self Care | Payer: Medicare Other | Source: Ambulatory Visit | Attending: Family Medicine | Admitting: Family Medicine

## 2015-09-28 DIAGNOSIS — R05 Cough: Secondary | ICD-10-CM | POA: Diagnosis not present

## 2015-09-28 DIAGNOSIS — M25522 Pain in left elbow: Secondary | ICD-10-CM

## 2015-09-28 DIAGNOSIS — M79622 Pain in left upper arm: Secondary | ICD-10-CM | POA: Diagnosis not present

## 2015-09-28 DIAGNOSIS — R059 Cough, unspecified: Secondary | ICD-10-CM

## 2015-09-28 DIAGNOSIS — R0602 Shortness of breath: Secondary | ICD-10-CM | POA: Diagnosis not present

## 2015-09-29 ENCOUNTER — Encounter: Payer: Self-pay | Admitting: Family Medicine

## 2015-10-02 ENCOUNTER — Other Ambulatory Visit: Payer: Self-pay | Admitting: Family Medicine

## 2015-10-02 MED ORDER — DOXAZOSIN MESYLATE 4 MG PO TABS: ORAL_TABLET | ORAL | Status: DC

## 2015-10-02 NOTE — Telephone Encounter (Signed)
Medication refilled per protocol. 

## 2015-10-05 ENCOUNTER — Other Ambulatory Visit: Payer: Self-pay | Admitting: Family Medicine

## 2015-10-05 MED ORDER — LOSARTAN POTASSIUM 50 MG PO TABS: ORAL_TABLET | ORAL | Status: DC

## 2015-10-05 NOTE — Telephone Encounter (Signed)
Prescription sent to pharmacy.

## 2015-10-05 NOTE — Telephone Encounter (Signed)
losartan (COZAAR) 50 MG tablet   [facility]  CB# [phone]

## 2015-10-06 ENCOUNTER — Other Ambulatory Visit: Payer: Self-pay | Admitting: Family Medicine

## 2015-10-06 ENCOUNTER — Telehealth: Payer: Self-pay | Admitting: *Deleted

## 2015-10-06 DIAGNOSIS — R918 Other nonspecific abnormal finding of lung field: Secondary | ICD-10-CM

## 2015-10-06 NOTE — Telephone Encounter (Signed)
Pt has appt scheduled for Wednesday April 19 at 10:15 with arrival of 10:00am at [facility] radiology, lmtrc to pt

## 2015-10-09 NOTE — Telephone Encounter (Signed)
Wife called and was given CT appt for Wednesday for husband

## 2015-10-11 ENCOUNTER — Ambulatory Visit (HOSPITAL_COMMUNITY)
Admission: RE | Admit: 2015-10-11 | Discharge: 2015-10-11 | Disposition: A | Discharge status: Home / Self Care | Payer: Medicare Other | Source: Ambulatory Visit | Attending: Family Medicine | Admitting: Family Medicine

## 2015-10-11 DIAGNOSIS — R918 Other nonspecific abnormal finding of lung field: Secondary | ICD-10-CM | POA: Diagnosis not present

## 2015-10-11 DIAGNOSIS — R938 Abnormal findings on diagnostic imaging of other specified body structures: Secondary | ICD-10-CM | POA: Insufficient documentation

## 2015-10-11 DIAGNOSIS — I2510 Atherosclerotic heart disease of native coronary artery without angina pectoris: Secondary | ICD-10-CM | POA: Insufficient documentation

## 2015-10-12 ENCOUNTER — Telehealth: Payer: Self-pay | Admitting: Family Medicine

## 2015-10-12 NOTE — Telephone Encounter (Signed)
LMTRC
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
## 2015-10-12 NOTE — Telephone Encounter (Signed)
Spoke to pt's wife and she states that he is just having a small local reaction. I informed her to watch for increased redness or bulls eye effect and extreme fatigue. If those sxs occur will need office visit. She verbalized understanding.

## 2015-10-12 NOTE — Telephone Encounter (Signed)
What symptoms is he having 

## 2015-10-25 ENCOUNTER — Telehealth: Payer: Self-pay | Admitting: Family Medicine

## 2015-10-25 NOTE — Telephone Encounter (Signed)
Patient needs hctz sent to [facility]  He does not know the number  [phone]

## 2015-10-26 MED ORDER — HYDROCHLOROTHIAZIDE 25 MG PO TABS: 25.0000 mg | ORAL_TABLET | Freq: Every day | ORAL | Status: DC

## 2015-10-26 NOTE — Telephone Encounter (Signed)
Medication called/sent to requested pharmacy  

## 2015-11-01 ENCOUNTER — Ambulatory Visit: Payer: Medicare Other | Admitting: Urology

## 2015-12-06 ENCOUNTER — Ambulatory Visit (INDEPENDENT_AMBULATORY_CARE_PROVIDER_SITE_OTHER): Payer: Medicare Other | Admitting: Urology

## 2015-12-06 DIAGNOSIS — R972 Elevated prostate specific antigen [PSA]: Secondary | ICD-10-CM

## 2015-12-06 DIAGNOSIS — R351 Nocturia: Secondary | ICD-10-CM

## 2015-12-09 ENCOUNTER — Other Ambulatory Visit: Payer: Self-pay | Admitting: Family Medicine

## 2015-12-12 NOTE — Telephone Encounter (Signed)
Refill appropriate and filled per protocol. 

## 2015-12-14 ENCOUNTER — Other Ambulatory Visit: Payer: Self-pay | Admitting: Family Medicine

## 2015-12-14 NOTE — Telephone Encounter (Signed)
Refill appropriate and filled per protocol. 

## 2016-01-04 ENCOUNTER — Encounter: Payer: Self-pay | Admitting: Family Medicine

## 2016-01-04 ENCOUNTER — Ambulatory Visit (INDEPENDENT_AMBULATORY_CARE_PROVIDER_SITE_OTHER): Payer: Medicare Other | Admitting: Family Medicine

## 2016-01-04 VITALS — BP 150/98 | HR 78 | Temp 98.2°F | Resp 18 | Ht 67.0 in | Wt 205.0 lb

## 2016-01-04 DIAGNOSIS — E785 Hyperlipidemia, unspecified: Secondary | ICD-10-CM | POA: Diagnosis not present

## 2016-01-04 DIAGNOSIS — I10 Essential (primary) hypertension: Secondary | ICD-10-CM | POA: Diagnosis not present

## 2016-01-04 LAB — COMPLETE METABOLIC PANEL WITH GFR
ALT: 24 U/L (ref 9–46)
AST: 18 U/L (ref 10–35)
Albumin: 4.5 g/dL (ref 3.6–5.1)
Alkaline Phosphatase: 69 U/L (ref 40–115)
BILIRUBIN TOTAL: 0.6 mg/dL (ref 0.2–1.2)
BUN: 20 mg/dL (ref 7–25)
CHLORIDE: 104 mmol/L (ref 98–110)
CO2: 28 mmol/L (ref 20–31)
Calcium: 9.2 mg/dL (ref 8.6–10.3)
Creat: 1.29 mg/dL — ABNORMAL HIGH (ref 0.70–1.25)
GFR, EST AFRICAN AMERICAN: 65 mL/min (ref >=60)
GFR, EST NON AFRICAN AMERICAN: 57 mL/min — AB (ref >=60)
Glucose, Bld: 101 mg/dL — ABNORMAL HIGH (ref 70–99)
POTASSIUM: 4.3 mmol/L (ref 3.5–5.3)
Sodium: 141 mmol/L (ref 135–146)
TOTAL PROTEIN: 6.5 g/dL (ref 6.1–8.1)

## 2016-01-04 LAB — LIPID PANEL
CHOL/HDL RATIO: 2.7 ratio (ref <=5.0)
Cholesterol: 161 mg/dL (ref 125–200)
HDL: 59 mg/dL (ref >=40)
LDL CALC: 89 mg/dL (ref <130)
TRIGLYCERIDES: 64 mg/dL (ref <150)
VLDL: 13 mg/dL (ref <30)
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
## 2016-01-05 ENCOUNTER — Encounter: Payer: Self-pay | Admitting: Family Medicine

## 2016-03-11 ENCOUNTER — Other Ambulatory Visit: Payer: Self-pay | Admitting: Family Medicine

## 2016-04-04 DIAGNOSIS — Z23 Encounter for immunization: Secondary | ICD-10-CM | POA: Diagnosis not present

## 2016-04-10 ENCOUNTER — Ambulatory Visit (INDEPENDENT_AMBULATORY_CARE_PROVIDER_SITE_OTHER): Payer: Medicare Other | Admitting: Urology

## 2016-04-10 DIAGNOSIS — R972 Elevated prostate specific antigen [PSA]: Secondary | ICD-10-CM

## 2016-04-10 DIAGNOSIS — R351 Nocturia: Secondary | ICD-10-CM

## 2016-05-03 DIAGNOSIS — Z23 Encounter for immunization: Secondary | ICD-10-CM | POA: Diagnosis not present

## 2016-06-13 ENCOUNTER — Other Ambulatory Visit: Payer: Self-pay | Admitting: Family Medicine

## 2016-07-25 ENCOUNTER — Telehealth: Payer: Self-pay

## 2016-07-25 NOTE — Telephone Encounter (Signed)
Pt called to set up TCS. He said that he would call back tomorrow when he had more time to talk

## 2016-07-25 NOTE — Telephone Encounter (Signed)
Last letter sent 10/02/2015. Will need new referral.

## 2016-07-30 ENCOUNTER — Telehealth: Payer: Self-pay

## 2016-07-30 NOTE — Telephone Encounter (Signed)
PATIENT CALLED AND STATED THAT HE CAN ONLY CALL DURING WORK TO SCHEDULE HIS TCS AND HE RECEIVED HIS LETTER LAST YEAR.  STATED HE WILL ATTEMPT TO CALL BACK TOMORROW DURING A BREAK TO GET THIS SCHEDULED .

## 2016-08-01 NOTE — Telephone Encounter (Signed)
Letter mailed to pt, please have PCP send new referral.

## 2016-08-01 NOTE — Telephone Encounter (Signed)
LMOM for pt to have new referral sent to me. Mailing a letter for him also.

## 2016-08-06 ENCOUNTER — Telehealth: Payer: Self-pay

## 2016-08-12 NOTE — Telephone Encounter (Signed)
Ok to schedule.

## 2016-08-12 NOTE — Telephone Encounter (Signed)
Gastroenterology Pre-Procedure Review  Request Date: 08/06/2016 Requesting Physician:  Jenna Luo, MD  PATIENT REVIEW QUESTIONS: The patient responded to the following health history questions as indicated:    1. Diabetes Melitis: no 2. Joint replacements in the past 12 months: no 3. Major health problems in the past 3 months: no 4. Has an artificial valve or MVP: no 5. Has a defibrillator: no 6. Has been advised in past to take antibiotics in advance of a procedure like teeth cleaning: no 7. Family history of colon cancer: no  8. Alcohol Use: Occasionally has a beer 9. History of sleep apnea: no  10. History of coronary artery or other vascular stents placed within the last 12 months: no    MEDICATIONS & ALLERGIES:    Patient reports the following regarding taking any blood thinners:   Plavix? no Aspirin? no Coumadin? no Brilinta? no Xarelto? no Eliquis? no Pradaxa? no Savaysa? no Effient? no  Patient confirms/reports the following medications:  Current Outpatient Prescriptions  Medication Sig Dispense Refill  . amLODipine (NORVASC) 10 MG tablet TAKE 1 TABLET DAILY 90 tablet 2  . BEE POLLEN PO Take by mouth.    . doxazosin (CARDURA) 4 MG tablet TAKE 1 TABLET DAILY 90 tablet 1  . hydrochlorothiazide (HYDRODIURIL) 25 MG tablet Take 1 tablet (25 mg total) by mouth daily. 90 tablet 3  . losartan (COZAAR) 50 MG tablet TAKE 1 TABLET DAILY 90 tablet 3  . Omega-3 Fatty Acids (FISH OIL) 1000 MG CAPS Take by mouth.    . pravastatin (PRAVACHOL) 40 MG tablet Take 1 tablet (40 mg total) by mouth daily. 90 tablet 3   No current facility-administered medications for this visit.     Patient confirms/reports the following allergies:  Allergies  Allergen Reactions  . Cold Medicine Plus [Chlorphen-Pseudoephed-Apap] Other (See Comments)    Contact Cold Medicine - Hives, swelling  . Lisinopril Cough  . Penicillins     No orders of the defined types were placed in this  encounter.   AUTHORIZATION INFORMATION Primary Insurance:  ID #: Group #:  Pre-Cert / Auth required:  Pre-Cert / Auth #:   Secondary Insurance:   ID #:   Group #:  Pre-Cert / Auth required:  Pre-Cert / Auth #:   SCHEDULE INFORMATION: Procedure has been scheduled as follows:  Date:               Time:   Location:   This Gastroenterology Pre-Precedure Review Form is being routed to the following provider(s): R. Garfield Cornea, MD

## 2016-08-13 ENCOUNTER — Telehealth: Payer: Self-pay | Admitting: Family Medicine

## 2016-08-13 DIAGNOSIS — Z1211 Encounter for screening for malignant neoplasm of colon: Secondary | ICD-10-CM

## 2016-08-13 NOTE — Telephone Encounter (Signed)
Ready to have colonoscopy done.  Wants referral to [facility] in [place].  Referral placed

## 2016-08-16 NOTE — Telephone Encounter (Signed)
LMOM to call for appt date and time.  

## 2016-08-19 NOTE — Telephone Encounter (Signed)
Mailed letter for pt to call for appt date and time.

## 2016-09-04 ENCOUNTER — Other Ambulatory Visit: Payer: Self-pay

## 2016-09-04 DIAGNOSIS — Z1211 Encounter for screening for malignant neoplasm of colon: Secondary | ICD-10-CM

## 2016-09-04 MED ORDER — PEG 3350-KCL-NA BICARB-NACL 420 G PO SOLR: 4000.0000 mL | ORAL | 0 refills | Status: DC

## 2016-09-04 NOTE — Telephone Encounter (Signed)
Pt called and has been scheduled for 10/18/2016 at 9:30 Am with Dr. Gala Romney. Sending Rx to the pharmacy and mailing the instructions to him.

## 2016-09-04 NOTE — Addendum Note (Signed)
Addended by: Everardo All on: 09/04/2016 03:14 PM   Modules accepted: Orders

## 2016-09-06 ENCOUNTER — Other Ambulatory Visit: Payer: Self-pay | Admitting: Family Medicine

## 2016-09-06 MED ORDER — PRAVASTATIN SODIUM 40 MG PO TABS: 40.0000 mg | ORAL_TABLET | Freq: Every day | ORAL | 3 refills | Status: DC

## 2016-09-18 ENCOUNTER — Telehealth: Payer: Self-pay | Admitting: Internal Medicine

## 2016-09-18 NOTE — Telephone Encounter (Signed)
Pt called to say that he wanted to cancel his colonoscopy that's scheduled with [facility] on 4/27 and does not want to reschedule.

## 2016-09-18 NOTE — Telephone Encounter (Signed)
I left Vm for pt to call if he would like to reschedule. I have taken off of the schedule and informed Kim in Endo. Forwarding to Dr. Dennard Schaumann for [place].

## 2016-10-15 ENCOUNTER — Telehealth: Payer: Self-pay

## 2016-10-15 NOTE — Telephone Encounter (Signed)
LMOM to call back

## 2016-10-18 ENCOUNTER — Ambulatory Visit (HOSPITAL_COMMUNITY): Admit: 2016-10-18 | Payer: Medicare Other | Admitting: Internal Medicine

## 2016-10-18 ENCOUNTER — Encounter (HOSPITAL_COMMUNITY): Payer: Self-pay

## 2016-10-18 SURGERY — COLONOSCOPY
Anesthesia: Moderate Sedation

## 2016-10-24 ENCOUNTER — Ambulatory Visit (INDEPENDENT_AMBULATORY_CARE_PROVIDER_SITE_OTHER): Payer: Medicare Other | Admitting: Family Medicine

## 2016-10-24 ENCOUNTER — Encounter: Payer: Self-pay | Admitting: Family Medicine

## 2016-10-24 VITALS — BP 110/74 | HR 60 | Temp 98.2°F | Resp 16 | Ht 67.0 in | Wt 210.0 lb

## 2016-10-24 DIAGNOSIS — Z0000 Encounter for general adult medical examination without abnormal findings: Secondary | ICD-10-CM | POA: Diagnosis not present

## 2016-10-24 DIAGNOSIS — I10 Essential (primary) hypertension: Secondary | ICD-10-CM | POA: Diagnosis not present

## 2016-10-24 DIAGNOSIS — E7800 Pure hypercholesterolemia, unspecified: Secondary | ICD-10-CM | POA: Diagnosis not present

## 2016-10-24 LAB — CBC WITH DIFFERENTIAL/PLATELET
BASOS ABS: 0 {cells}/uL (ref 0–200)
Basophils Relative: 0 %
EOS ABS: 66 {cells}/uL (ref 15–500)
Eosinophils Relative: 1 %
HEMATOCRIT: 45 % (ref 38.5–50.0)
HEMOGLOBIN: 15 g/dL (ref 13.0–17.0)
LYMPHS PCT: 28 %
Lymphs Abs: 1848 cells/uL (ref 850–3900)
MCH: 29.1 pg (ref 27.0–33.0)
MCHC: 33.3 g/dL (ref 32.0–36.0)
MCV: 87.4 fL (ref 80.0–100.0)
MONO ABS: 528 {cells}/uL (ref 200–950)
MONOS PCT: 8 %
MPV: 10.3 fL (ref 7.5–12.5)
NEUTROS PCT: 63 %
Neutro Abs: 4158 cells/uL (ref 1500–7800)
Platelets: 223 10*3/uL (ref 140–400)
RBC: 5.15 MIL/uL (ref 4.20–5.80)
RDW: 13.8 % (ref 11.0–15.0)
WBC: 6.6 10*3/uL (ref 3.8–10.8)

## 2016-10-24 LAB — LIPID PANEL
CHOLESTEROL: 184 mg/dL (ref <200)
HDL: 56 mg/dL (ref >40)
LDL Cholesterol: 105 mg/dL — ABNORMAL HIGH (ref <100)
Total CHOL/HDL Ratio: 3.3 Ratio (ref <5.0)
Triglycerides: 115 mg/dL (ref <150)
VLDL: 23 mg/dL (ref <30)

## 2016-10-24 LAB — COMPLETE METABOLIC PANEL WITH GFR
ALK PHOS: 63 U/L (ref 40–115)
ALT: 28 U/L (ref 9–46)
AST: 22 U/L (ref 10–35)
Albumin: 4.7 g/dL (ref 3.6–5.1)
BILIRUBIN TOTAL: 0.5 mg/dL (ref 0.2–1.2)
BUN: 22 mg/dL (ref 7–25)
CALCIUM: 9.8 mg/dL (ref 8.6–10.3)
CHLORIDE: 101 mmol/L (ref 98–110)
CO2: 26 mmol/L (ref 20–31)
CREATININE: 1.25 mg/dL (ref 0.70–1.25)
GFR, Est African American: 67 mL/min (ref >=60)
GFR, Est Non African American: 58 mL/min — ABNORMAL LOW (ref >=60)
Glucose, Bld: 109 mg/dL — ABNORMAL HIGH (ref 70–99)
Potassium: 4.1 mmol/L (ref 3.5–5.3)
Sodium: 140 mmol/L (ref 135–146)
TOTAL PROTEIN: 7.2 g/dL (ref 6.1–8.1)
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
## 2016-10-25 ENCOUNTER — Encounter: Payer: Self-pay | Admitting: Family Medicine

## 2016-10-25 LAB — PSA: PSA: 2.1 ng/mL (ref <=4.0)

## 2016-10-30 ENCOUNTER — Ambulatory Visit: Payer: Medicare Other | Admitting: Urology

## 2016-12-12 ENCOUNTER — Telehealth: Payer: Self-pay | Admitting: Family Medicine

## 2016-12-12 MED ORDER — DOXAZOSIN MESYLATE 4 MG PO TABS: 4.0000 mg | ORAL_TABLET | Freq: Every day | ORAL | 3 refills | Status: DC

## 2016-12-12 MED ORDER — AMLODIPINE BESYLATE 10 MG PO TABS: 10.0000 mg | ORAL_TABLET | Freq: Every day | ORAL | 3 refills | Status: DC

## 2016-12-12 MED ORDER — LOSARTAN POTASSIUM 50 MG PO TABS: ORAL_TABLET | ORAL | 3 refills | Status: DC

## 2016-12-12 MED ORDER — PRAVASTATIN SODIUM 40 MG PO TABS: 40.0000 mg | ORAL_TABLET | Freq: Every day | ORAL | 3 refills | Status: DC

## 2016-12-12 MED ORDER — HYDROCHLOROTHIAZIDE 25 MG PO TABS: 25.0000 mg | ORAL_TABLET | Freq: Every day | ORAL | 3 refills | Status: DC

## 2016-12-12 NOTE — Telephone Encounter (Signed)
Medication called/sent to requested pharmacy  

## 2016-12-12 NOTE — Telephone Encounter (Signed)
Pt needs refill on norvasc, cardura, hydrochlorothiazide, losartan, trilyte, and pravastatin sent to [facility].

## 2017-04-09 ENCOUNTER — Telehealth: Payer: Self-pay | Admitting: Family Medicine

## 2017-04-09 MED ORDER — AMLODIPINE BESYLATE 10 MG PO TABS: 10.0000 mg | ORAL_TABLET | Freq: Every day | ORAL | 0 refills | Status: DC

## 2017-04-09 MED ORDER — LOSARTAN POTASSIUM 50 MG PO TABS: ORAL_TABLET | ORAL | 0 refills | Status: DC

## 2017-04-09 MED ORDER — DOXAZOSIN MESYLATE 4 MG PO TABS: 4.0000 mg | ORAL_TABLET | Freq: Every day | ORAL | 0 refills | Status: DC

## 2017-04-09 MED ORDER — HYDROCHLOROTHIAZIDE 25 MG PO TABS: 25.0000 mg | ORAL_TABLET | Freq: Every day | ORAL | 0 refills | Status: DC

## 2017-04-09 NOTE — Telephone Encounter (Signed)
req 30 day supply of his medication, not cholesterol pill to [facility] while waiting for mail order

## 2017-04-10 MED ORDER — LOSARTAN POTASSIUM 50 MG PO TABS: ORAL_TABLET | ORAL | 0 refills | Status: DC

## 2017-04-10 NOTE — Addendum Note (Signed)
Addended by: Olena Mater on: 04/10/2017 08:24 AM   Modules accepted: Orders

## 2017-04-15 DIAGNOSIS — Z23 Encounter for immunization: Secondary | ICD-10-CM | POA: Diagnosis not present

## 2017-06-26 ENCOUNTER — Encounter: Payer: Self-pay | Admitting: Physician Assistant

## 2017-06-26 ENCOUNTER — Ambulatory Visit (INDEPENDENT_AMBULATORY_CARE_PROVIDER_SITE_OTHER): Payer: Medicare Other | Admitting: Physician Assistant

## 2017-06-26 VITALS — BP 128/76 | HR 79 | Temp 97.9°F | Resp 16 | Ht 67.0 in | Wt 203.2 lb

## 2017-06-26 DIAGNOSIS — R52 Pain, unspecified: Secondary | ICD-10-CM

## 2017-06-26 DIAGNOSIS — J029 Acute pharyngitis, unspecified: Secondary | ICD-10-CM | POA: Diagnosis not present

## 2017-06-26 DIAGNOSIS — J0100 Acute maxillary sinusitis, unspecified: Secondary | ICD-10-CM | POA: Diagnosis not present

## 2017-06-26 LAB — INFLUENZA A AND B AG, IMMUNOASSAY
INFLUENZA A ANTIGEN: NOT DETECTED
INFLUENZA B ANTIGEN: NOT DETECTED

## 2017-06-26 MED ORDER — CEFDINIR 300 MG PO CAPS: 300.0000 mg | ORAL_CAPSULE | Freq: Two times a day (BID) | ORAL | 0 refills | Status: DC

## 2017-06-26 MED ORDER — PREDNISONE 20 MG PO TABS: ORAL_TABLET | ORAL | 0 refills | Status: DC

## 2017-06-26 NOTE — Progress Notes (Signed)
[name] MRN: [id number], DOB: 04-08-1946, 72 y.o. Date of Encounter: 06/26/2017, 10:33 AM    Chief Complaint:  Chief Complaint  Patient presents with  . Headache    x4days  . Generalized Body Aches  . Sore Throat  . Nausea     HPI: 72 y.o. year old male presents with above.   Reports that he is feeling pain and pressure over the left side of his head and he holds his hand over his left cheek and left forehead as area of pain.   States that it is also feeling pain towards his left ear and down behind the base of his left ear and into those lymph nodes.  States that he is not blowing anything out of his nose.  Says that he has some drainage down his throat but that is chronic.  Has had minimal cough.  Has felt symptomatic fevers and chills but no documented fever.  Temperature is 97.9 here now. States that he has been taking 2 ibuprofen routinely and then using NyQuil at night.  Has been feeling body aches as well as the symptoms of fevers and chills.  Home Meds:   Outpatient Medications Prior to Visit  Medication Sig Dispense Refill  . amLODipine (NORVASC) 10 MG tablet Take 1 tablet (10 mg total) by mouth daily. 30 tablet 0  . doxazosin (CARDURA) 4 MG tablet Take 1 tablet (4 mg total) by mouth daily. 30 tablet 0  . hydrochlorothiazide (HYDRODIURIL) 25 MG tablet Take 1 tablet (25 mg total) by mouth daily. 30 tablet 0  . losartan (COZAAR) 50 MG tablet TAKE 1 TABLET DAILY 30 tablet 0  . pravastatin (PRAVACHOL) 40 MG tablet Take 1 tablet (40 mg total) by mouth daily. 90 tablet 3  . polyethylene glycol-electrolytes (TRILYTE) 420 g solution Take 4,000 mLs by mouth as directed. (Patient not taking: Reported on 06/26/2017) 4000 mL 0  . BEE POLLEN PO Take by mouth.    . losartan (COZAAR) 50 MG tablet TAKE 1 TABLET DAILY 30 tablet 0  . Omega-3 Fatty Acids (FISH OIL) 1000 MG CAPS Take by mouth.     No facility-administered medications prior to visit.     Allergies:    Allergies  Allergen Reactions  . Cold Medicine Plus [Chlorphen-Pseudoephed-Apap] Other (See Comments)    Contact Cold Medicine - Hives, swelling  . Lisinopril Cough  . Penicillins       Review of Systems: See HPI for pertinent ROS. All other ROS negative.    Physical Exam: Blood pressure 128/76, pulse 79, temperature 97.9 F (36.6 C), temperature source Oral, resp. rate 16, height 5\' 7"  (1.702 m), weight 92.2 kg (203 lb 3.2 oz), SpO2 96 %., Body mass index is 31.83 kg/m. General:  WM. Appears in no acute distress. HEENT: Normocephalic, atraumatic, eyes without discharge, sclera non-icteric, nares are without discharge. Bilateral auditory canals clear. Right TM appears normal. Left TM appears dull, retracted.  Oral cavity moist, posterior pharynx without exudate, erythema, peritonsillar abscess.  He has tenderness with percussion to the upper aspect of the left maxillary sinus and to the left frontal sinus.  He has tenderness with palpation of the left posterior cervical lymph node. Neck: Supple. No thyromegaly.  Lungs: Clear bilaterally to auscultation without wheezes, rales, or rhonchi. Breathing is unlabored. Heart: Regular rhythm. No murmurs, rubs, or gallops. Msk:  Strength and tone normal for age. Extremities/Skin: Warm and dry.  Neuro: Alert and oriented X 3. Moves all extremities spontaneously.  Gait is normal. CNII-XII grossly in tact. Psych:  Responds to questions appropriately with a normal affect.   Results for orders placed or performed in visit on 06/26/17  STREP GROUP A AG, W/REFLEX TO CULT  Result Value Ref Range   Streptococcus, Group A Screen (Direct) NONE DETECTED   Influenza A and B Ag, Immunoassay  Result Value Ref Range   Source: NASAL    INFLUENZA A ANTIGEN NOT DETECTED NOT DETECT   INFLUENZA B ANTIGEN NOT DETECTED NOT DETECT     ASSESSMENT AND PLAN:  72 y.o. year old male with  1. Acute maxillary sinusitis, recurrence not specified He has allergy to  penicillin so will avoid Augmentin.  Hopefully he will tolerate Omnicef.  To take the [facility] and the prednisone as directed.  Follow-up if symptoms do not resolve after completion of these. - predniSONE (DELTASONE) 20 MG tablet; Take 3 daily for 2 days, then 2 daily for 2 days, then 1 daily for 2 days.  Dispense: 12 tablet; Refill: 0 - cefdinir (OMNICEF) 300 MG capsule; Take 1 capsule (300 mg total) by mouth 2 (two) times daily.  Dispense: 20 capsule; Refill: 0  2. Generalized body aches - Influenza A and B Ag, Immunoassay  3. Sore throat - STREP GROUP A AG, W/REFLEX TO CULT   Signed, [address] [place], [place], [facility] 06/26/2017 10:33 AM

## 2017-06-28 LAB — CULTURE, GROUP A STREP
MICRO NUMBER:: 90008563
SOURCE: 0
SPECIMEN QUALITY: ADEQUATE

## 2017-06-28 LAB — STREP GROUP A AG, W/REFLEX TO CULT: Streptococcus, Group A Screen (Direct): NOT DETECTED

## 2017-07-17 ENCOUNTER — Encounter: Payer: Self-pay | Admitting: Family Medicine

## 2017-07-17 ENCOUNTER — Ambulatory Visit (INDEPENDENT_AMBULATORY_CARE_PROVIDER_SITE_OTHER): Payer: Medicare Other | Admitting: Family Medicine

## 2017-07-17 VITALS — BP 126/72 | HR 84 | Temp 98.7°F | Resp 16 | Ht 67.0 in | Wt 209.0 lb

## 2017-07-17 DIAGNOSIS — H6122 Impacted cerumen, left ear: Secondary | ICD-10-CM
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
## 2017-11-06 ENCOUNTER — Other Ambulatory Visit: Payer: Medicare Other

## 2017-11-06 DIAGNOSIS — Z125 Encounter for screening for malignant neoplasm of prostate: Secondary | ICD-10-CM

## 2017-11-06 DIAGNOSIS — Z0000 Encounter for general adult medical examination without abnormal findings: Secondary | ICD-10-CM

## 2017-11-06 DIAGNOSIS — E7800 Pure hypercholesterolemia, unspecified: Secondary | ICD-10-CM | POA: Diagnosis not present

## 2017-11-06 DIAGNOSIS — I10 Essential (primary) hypertension: Secondary | ICD-10-CM | POA: Diagnosis not present

## 2017-11-07 LAB — LIPID PANEL
CHOLESTEROL: 185 mg/dL (ref <200)
HDL: 60 mg/dL (ref >40)
LDL Cholesterol (Calc): 108 mg/dL (calc) — ABNORMAL HIGH
Non-HDL Cholesterol (Calc): 125 mg/dL (calc) (ref <130)
Total CHOL/HDL Ratio: 3.1 (calc) (ref <5.0)
Triglycerides: 83 mg/dL (ref <150)

## 2017-11-07 LAB — CBC WITH DIFFERENTIAL/PLATELET
Basophils Absolute: 28 cells/uL (ref 0–200)
Basophils Relative: 0.4 %
EOS ABS: 57 {cells}/uL (ref 15–500)
Eosinophils Relative: 0.8 %
HCT: 43 % (ref 38.5–50.0)
Hemoglobin: 14.9 g/dL (ref 13.2–17.1)
Lymphs Abs: 2308 cells/uL (ref 850–3900)
MCH: 29.6 pg (ref 27.0–33.0)
MCHC: 34.7 g/dL (ref 32.0–36.0)
MCV: 85.5 fL (ref 80.0–100.0)
MPV: 10.4 fL (ref 7.5–12.5)
Monocytes Relative: 7.4 %
Neutro Abs: 4182 cells/uL (ref 1500–7800)
Neutrophils Relative %: 58.9 %
Platelets: 227 10*3/uL (ref 140–400)
RBC: 5.03 10*6/uL (ref 4.20–5.80)
RDW: 12.5 % (ref 11.0–15.0)
TOTAL LYMPHOCYTE: 32.5 %
WBC: 7.1 10*3/uL (ref 3.8–10.8)
WBCMIX: 525 {cells}/uL (ref 200–950)

## 2017-11-07 LAB — COMPREHENSIVE METABOLIC PANEL
AG RATIO: 2 (calc) (ref 1.0–2.5)
ALKALINE PHOSPHATASE (APISO): 73 U/L (ref 40–115)
ALT: 26 U/L (ref 9–46)
AST: 20 U/L (ref 10–35)
Albumin: 4.5 g/dL (ref 3.6–5.1)
BILIRUBIN TOTAL: 0.6 mg/dL (ref 0.2–1.2)
BUN/Creatinine Ratio: 18 (calc) (ref 6–22)
BUN: 22 mg/dL (ref 7–25)
CALCIUM: 9.9 mg/dL (ref 8.6–10.3)
CHLORIDE: 102 mmol/L (ref 98–110)
CO2: 31 mmol/L (ref 20–32)
Creat: 1.23 mg/dL — ABNORMAL HIGH (ref 0.70–1.18)
GLOBULIN: 2.3 g/dL (ref 1.9–3.7)
Glucose, Bld: 106 mg/dL — ABNORMAL HIGH (ref 65–99)
Potassium: 4.5 mmol/L (ref 3.5–5.3)
Sodium: 140 mmol/L (ref 135–146)
Total Protein: 6.8 g/dL (ref 6.1–8.1)

## 2017-11-07 LAB — PSA: PSA: 2.4 ng/mL (ref <=4.0)

## 2017-11-11 ENCOUNTER — Other Ambulatory Visit: Payer: Self-pay

## 2017-11-11 ENCOUNTER — Ambulatory Visit (INDEPENDENT_AMBULATORY_CARE_PROVIDER_SITE_OTHER): Payer: Medicare Other | Admitting: Family Medicine

## 2017-11-11 VITALS — BP 136/82 | HR 81 | Temp 98.7°F | Ht 67.0 in | Wt 208.0 lb

## 2017-11-11 DIAGNOSIS — I10 Essential (primary) hypertension: Secondary | ICD-10-CM

## 2017-11-11 DIAGNOSIS — E7800 Pure hypercholesterolemia, unspecified: Secondary | ICD-10-CM | POA: Diagnosis not present

## 2017-11-11 DIAGNOSIS — Z0000 Encounter for general adult medical examination without abnormal findings: Secondary | ICD-10-CM

## 2017-11-11 MED ORDER — ROSUVASTATIN CALCIUM 20 MG PO TABS: 20.0000 mg | ORAL_TABLET | Freq: Every day | ORAL | 3 refills | Status: DC
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
## 2017-11-12 ENCOUNTER — Telehealth: Payer: Self-pay | Admitting: *Deleted

## 2017-11-12 NOTE — Telephone Encounter (Signed)
Received verbal orders for Cologuard.   Order [id number] placed via [facility].

## 2017-11-13 ENCOUNTER — Other Ambulatory Visit: Payer: Self-pay | Admitting: Family Medicine

## 2017-11-13 MED ORDER — ROSUVASTATIN CALCIUM 20 MG PO TABS: 20.0000 mg | ORAL_TABLET | Freq: Every day | ORAL | 3 refills | Status: DC

## 2017-11-13 MED ORDER — AMLODIPINE BESYLATE 10 MG PO TABS: 10.0000 mg | ORAL_TABLET | Freq: Every day | ORAL | 3 refills | Status: DC

## 2017-11-13 MED ORDER — LOSARTAN POTASSIUM 50 MG PO TABS: ORAL_TABLET | ORAL | 3 refills | Status: DC

## 2017-11-13 MED ORDER — DOXAZOSIN MESYLATE 4 MG PO TABS: 4.0000 mg | ORAL_TABLET | Freq: Every day | ORAL | 3 refills | Status: DC

## 2017-11-13 MED ORDER — ZOSTER VAC RECOMB ADJUVANTED 50 MCG/0.5ML IM SUSR: 0.5000 mL | Freq: Once | INTRAMUSCULAR | 0 refills | Status: AC

## 2017-11-13 MED ORDER — HYDROCHLOROTHIAZIDE 25 MG PO TABS: 25.0000 mg | ORAL_TABLET | Freq: Every day | ORAL | 3 refills | Status: DC

## 2017-11-13 NOTE — Telephone Encounter (Signed)
Prescriptions sent to pharmacy for routine medications and Shingrix.   Cologuard ordered.

## 2017-11-13 NOTE — Telephone Encounter (Signed)
Pt needs refill on norvasc, cardura, hydrochlorothiazide, losartan, and crestor called in to [facility], pt also wanted to let us know [facility] covers shingrix, and his regular [facility] covers the cologuard.

## 2017-12-18 ENCOUNTER — Ambulatory Visit (HOSPITAL_COMMUNITY)
Admission: RE | Admit: 2017-12-18 | Discharge: 2017-12-18 | Disposition: A | Discharge status: Home / Self Care | Payer: Medicare Other | Source: Ambulatory Visit | Attending: Physician Assistant | Admitting: Physician Assistant

## 2017-12-18 ENCOUNTER — Encounter: Payer: Self-pay | Admitting: Physician Assistant

## 2017-12-18 ENCOUNTER — Encounter: Payer: Self-pay | Admitting: Family Medicine

## 2017-12-18 ENCOUNTER — Ambulatory Visit (INDEPENDENT_AMBULATORY_CARE_PROVIDER_SITE_OTHER): Payer: Medicare Other | Admitting: Physician Assistant

## 2017-12-18 VITALS — BP 138/74 | HR 84 | Temp 99.2°F | Ht 67.0 in | Wt 200.0 lb

## 2017-12-18 DIAGNOSIS — M7989 Other specified soft tissue disorders: Secondary | ICD-10-CM | POA: Diagnosis not present

## 2017-12-18 DIAGNOSIS — M79662 Pain in left lower leg: Secondary | ICD-10-CM | POA: Insufficient documentation

## 2017-12-18 DIAGNOSIS — R600 Localized edema: Secondary | ICD-10-CM | POA: Diagnosis not present

## 2017-12-18 MED ORDER — SULFAMETHOXAZOLE-TRIMETHOPRIM 800-160 MG PO TABS: 1.0000 | ORAL_TABLET | Freq: Two times a day (BID) | ORAL | 0 refills | Status: DC
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
## 2018-01-01 DIAGNOSIS — Z1212 Encounter for screening for malignant neoplasm of rectum: Secondary | ICD-10-CM | POA: Diagnosis not present

## 2018-01-01 DIAGNOSIS — Z1211 Encounter for screening for malignant neoplasm of colon: Secondary | ICD-10-CM | POA: Diagnosis not present

## 2018-01-01 LAB — COLOGUARD

## 2018-01-12 ENCOUNTER — Telehealth: Payer: Self-pay | Admitting: Family Medicine

## 2018-01-12 ENCOUNTER — Other Ambulatory Visit: Payer: Self-pay | Admitting: Family Medicine

## 2018-01-12 DIAGNOSIS — R195 Other fecal abnormalities: Secondary | ICD-10-CM

## 2018-01-12 NOTE — Telephone Encounter (Signed)
Received cologuard results - positive. Per Dr. Dennard Schaumann pt needs GI consults and referral already placed.   Called pt - [facility]

## 2018-01-13 NOTE — Telephone Encounter (Signed)
LMTRC

## 2018-01-14 NOTE — Telephone Encounter (Signed)
Pt called back [facility]OV[facility] stating that d/t his work to please call and leave results on his home # vm. Called pt and left detailed message on VM stating that his Cologuard was positive and he needed to see GI. Informed him to call back either way to let me know what he would like to do.

## 2018-01-15 NOTE — Telephone Encounter (Signed)
Pt called back and is willing to see GI - referral placed.

## 2018-01-16 ENCOUNTER — Encounter: Payer: Self-pay | Admitting: Internal Medicine

## 2018-04-13 DIAGNOSIS — Z23 Encounter for immunization: Secondary | ICD-10-CM | POA: Diagnosis not present
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
## 2018-04-27 ENCOUNTER — Encounter: Payer: Self-pay | Admitting: Nurse Practitioner

## 2018-04-27 ENCOUNTER — Other Ambulatory Visit: Payer: Self-pay

## 2018-04-27 ENCOUNTER — Ambulatory Visit (INDEPENDENT_AMBULATORY_CARE_PROVIDER_SITE_OTHER): Payer: Medicare Other | Admitting: Nurse Practitioner

## 2018-04-27 DIAGNOSIS — R195 Other fecal abnormalities: Secondary | ICD-10-CM | POA: Diagnosis not present

## 2018-04-27 MED ORDER — NA SULFATE-K SULFATE-MG SULF 17.5-3.13-1.6 GM/177ML PO SOLN: 1.0000 | ORAL | 0 refills | Status: DC

## 2018-04-27 NOTE — Progress Notes (Signed)
cc'd to pcp 

## 2018-04-27 NOTE — Patient Instructions (Signed)
1. We will schedule your colonoscopy for you. 2. Further recommendations will be made after your colonoscopy. 3. Return for follow-up based on the recommendations made after your colonoscopy. 4. Call us if you have any questions or concerns.  At [facility] we value your feedback. You may receive a survey about your visit today. Please share your experience as we strive to create trusting relationships with our patients to provide genuine, compassionate, quality care.  We appreciate your understanding and patience as we review any laboratory studies, imaging, and other diagnostic tests that are ordered as we care for you. Our office policy is 5 business days for review of these results, and any emergent or urgent results are addressed in a timely manner for your best interest. If you do not hear from our office in 1 week, please contact us.   We also encourage the use of MyChart, which contains your medical information for your review as well. If you are not enrolled in this feature, an access code is on this after visit summary for your convenience. Thank you for allowing [place] to be involved in your care.  It was great to meet you today!  I hope you have a great Fall!!

## 2018-04-27 NOTE — Assessment & Plan Note (Signed)
72 year old male who is never had a colonoscopy, previously declined exam.  His primary care did Cologuard testing and this is positive.  He is now agreeable to a colonoscopy.  He is generally asymptomatic from a GI standpoint.  He understands the risks and benefits of colonoscopy.  At this point we will proceed with first ever diagnostic colonoscopy due to positive Cologuard.  Proceed with TCS with Dr. Gala Romney in near future: the risks, benefits, and alternatives have been discussed with the patient in detail. The patient states understanding and desires to proceed.  The patient is not on any anticoagulants, anxiolytics, chronic pain medications, or antidepressants.  Conscious sedation should be adequate for his procedure.

## 2018-07-01 ENCOUNTER — Encounter (HOSPITAL_COMMUNITY)
Admission: RE | Disposition: A | Discharge status: Home / Self Care | Payer: Self-pay | Source: Home / Self Care | Attending: Internal Medicine

## 2018-07-01 ENCOUNTER — Ambulatory Visit (HOSPITAL_COMMUNITY)
Admission: RE | Admit: 2018-07-01 | Discharge: 2018-07-01 | Disposition: A | Discharge status: Home / Self Care | Payer: Medicare Other | Attending: Internal Medicine | Admitting: Internal Medicine

## 2018-07-01 ENCOUNTER — Other Ambulatory Visit: Payer: Self-pay

## 2018-07-01 ENCOUNTER — Encounter (HOSPITAL_COMMUNITY): Payer: Self-pay | Admitting: *Deleted

## 2018-07-01 DIAGNOSIS — R195 Other fecal abnormalities: Secondary | ICD-10-CM | POA: Diagnosis not present

## 2018-07-01 DIAGNOSIS — D124 Benign neoplasm of descending colon: Secondary | ICD-10-CM | POA: Diagnosis not present

## 2018-07-01 DIAGNOSIS — Z87891 Personal history of nicotine dependence: Secondary | ICD-10-CM | POA: Diagnosis not present

## 2018-07-01 DIAGNOSIS — Z79899 Other long term (current) drug therapy: Secondary | ICD-10-CM | POA: Diagnosis not present

## 2018-07-01 DIAGNOSIS — Z880 Allergy status to penicillin: Secondary | ICD-10-CM | POA: Insufficient documentation

## 2018-07-01 DIAGNOSIS — E785 Hyperlipidemia, unspecified: Secondary | ICD-10-CM | POA: Diagnosis not present

## 2018-07-01 DIAGNOSIS — Z888 Allergy status to other drugs, medicaments and biological substances status: Secondary | ICD-10-CM | POA: Insufficient documentation

## 2018-07-01 DIAGNOSIS — D123 Benign neoplasm of transverse colon: Secondary | ICD-10-CM | POA: Diagnosis not present

## 2018-07-01 DIAGNOSIS — I10 Essential (primary) hypertension: Secondary | ICD-10-CM | POA: Insufficient documentation

## 2018-07-01 DIAGNOSIS — K5730 Diverticulosis of large intestine without perforation or abscess without bleeding: Secondary | ICD-10-CM | POA: Insufficient documentation

## 2018-07-01 HISTORY — PX: POLYPECTOMY: SHX5525

## 2018-07-01 HISTORY — PX: COLONOSCOPY: SHX5424

## 2018-07-01 SURGERY — COLONOSCOPY
Anesthesia: Moderate Sedation

## 2018-07-01 MED ORDER — SPOT INK MARKER SYRINGE KIT
PACK | SUBMUCOSAL | Status: AC
  Filled 2018-07-01: qty 5

## 2018-07-01 MED ORDER — ONDANSETRON HCL 4 MG/2ML IJ SOLN
INTRAMUSCULAR | Status: DC | PRN
  Administered 2018-07-01: 4 mg via INTRAVENOUS

## 2018-07-01 MED ORDER — MEPERIDINE HCL 100 MG/ML IJ SOLN
INTRAMUSCULAR | Status: DC | PRN
  Administered 2018-07-01: 15 mg
  Administered 2018-07-01: 25 mg

## 2018-07-01 MED ORDER — MEPERIDINE HCL 50 MG/ML IJ SOLN
INTRAMUSCULAR | Status: AC
  Filled 2018-07-01: qty 1

## 2018-07-01 MED ORDER — MIDAZOLAM HCL 5 MG/5ML IJ SOLN
INTRAMUSCULAR | Status: AC
  Filled 2018-07-01: qty 10

## 2018-07-01 MED ORDER — STERILE WATER FOR IRRIGATION IR SOLN
Status: DC | PRN
  Administered 2018-07-01: 1.5 mL

## 2018-07-01 MED ORDER — SODIUM CHLORIDE 0.9 % IV SOLN
INTRAVENOUS | Status: DC
  Administered 2018-07-01: 08:00:00 via INTRAVENOUS

## 2018-07-01 MED ORDER — SPOT INK MARKER SYRINGE KIT
PACK | SUBMUCOSAL | Status: DC | PRN
  Administered 2018-07-01: 3 mL via SUBMUCOSAL

## 2018-07-01 MED ORDER — MIDAZOLAM HCL 5 MG/5ML IJ SOLN
INTRAMUSCULAR | Status: DC | PRN
  Administered 2018-07-01 (×5): 1 mg via INTRAVENOUS
  Administered 2018-07-01: 2 mg via INTRAVENOUS
  Administered 2018-07-01 (×3): 1 mg via INTRAVENOUS

## 2018-07-01 MED ORDER — ONDANSETRON HCL 4 MG/2ML IJ SOLN
INTRAMUSCULAR | Status: AC
  Filled 2018-07-01: qty 2

## 2018-07-01 NOTE — Discharge Instructions (Signed)
Colonoscopy Discharge Instructions  Read the instructions outlined below and refer to this sheet in the next few weeks. These discharge instructions provide you with general information on caring for yourself after you leave the hospital. Your doctor may also give you specific instructions. While your treatment has been planned according to the most current medical practices available, unavoidable complications occasionally occur. If you have any problems or questions after discharge, call Dr. Gala Romney at [phone]. ACTIVITY  You may resume your regular activity, but move at a slower pace for the next 24 hours.   Take frequent rest periods for the next 24 hours.   Walking will help get rid of the air and reduce the bloated feeling in your belly (abdomen).   No driving for 24 hours (because of the medicine (anesthesia) used during the test).    Do not sign any important legal documents or operate any machinery for 24 hours (because of the anesthesia used during the test).  NUTRITION  Drink plenty of fluids.   You may resume your normal diet as instructed by your doctor.   Begin with a light meal and progress to your normal diet. Heavy or fried foods are harder to digest and may make you feel sick to your stomach (nauseated).   Avoid alcoholic beverages for 24 hours or as instructed.  MEDICATIONS  You may resume your normal medications unless your doctor tells you otherwise.  WHAT YOU CAN EXPECT TODAY  Some feelings of bloating in the abdomen.   Passage of more gas than usual.   Spotting of blood in your stool or on the toilet paper.  IF YOU HAD POLYPS REMOVED DURING THE COLONOSCOPY:  No aspirin products for 7 days or as instructed.   No alcohol for 7 days or as instructed.   Eat a soft diet for the next 24 hours.  FINDING OUT THE RESULTS OF YOUR TEST Not all test results are available during your visit. If your test results are not back during the visit, make an appointment  with your caregiver to find out the results. Do not assume everything is normal if you have not heard from your caregiver or the medical facility. It is important for you to follow up on all of your test results.  SEEK IMMEDIATE MEDICAL ATTENTION IF:  You have more than a spotting of blood in your stool.   Your belly is swollen (abdominal distention).   You are nauseated or vomiting.   You have a temperature over 101.   You have abdominal pain or discomfort that is severe or gets worse throughout the day.   Colon Polyp and diverticulosis information provided  No aspirin or arthritis medications x10 days  Further recommendations to follow pending review of pathology report   Colon Polyps  Polyps are tissue growths inside the body. Polyps can grow in many places, including the large intestine (colon). A polyp may be a round bump or a mushroom-shaped growth. You could have one polyp or several. Most colon polyps are noncancerous (benign). However, some colon polyps can become cancerous over time. Finding and removing the polyps early can help prevent this. What are the causes? The exact cause of colon polyps is not known. What increases the risk? You are more likely to develop this condition if you:  Have a family history of colon cancer or colon polyps.  Are older than 40 or older than 45 if you are African American.  Have inflammatory bowel disease, such as ulcerative colitis  or Crohn's disease.  Have certain hereditary conditions, such as: ? Familial adenomatous polyposis. ? Lynch syndrome. ? Turcot syndrome. ? Peutz-Jeghers syndrome.  Are overweight.  Smoke cigarettes.  Do not get enough exercise.  Drink too much alcohol.  Eat a diet that is high in fat and red meat and low in fiber.  Had childhood cancer that was treated with abdominal radiation. What are the signs or symptoms? Most polyps do not cause symptoms. If you have symptoms, they may include:  Blood  coming from your rectum when having a bowel movement.  Blood in your stool. The stool may look dark red or black.  Abdominal pain.  A change in bowel habits, such as constipation or diarrhea. How is this diagnosed? This condition is diagnosed with a colonoscopy. This is a procedure in which a lighted, flexible scope is inserted into the anus and then passed into the colon to examine the area. Polyps are sometimes found when a colonoscopy is done as part of routine cancer screening tests. How is this treated? Treatment for this condition involves removing any polyps that are found. Most polyps can be removed during a colonoscopy. Those polyps will then be tested for cancer. Additional treatment may be needed depending on the results of testing. Follow these instructions at home: Lifestyle  Maintain a healthy weight, or lose weight if recommended by your health care provider.  Exercise every day or as told by your health care provider.  Do not use any products that contain nicotine or tobacco, such as cigarettes and e-cigarettes. If you need help quitting, ask your health care provider.  If you drink alcohol, limit how much you have: ? 0-1 drink a day for women. ? 0-2 drinks a day for men.  Be aware of how much alcohol is in your drink. In the [place] one drink equals one 12 oz bottle of beer (355 mL), one 5 oz glass of wine (148 mL), or one 1 oz shot of hard liquor (44 mL). Eating and drinking   Eat foods that are high in fiber, such as fruits, vegetables, and whole grains.  Eat foods that are high in calcium and vitamin D, such as milk, cheese, yogurt, eggs, liver, fish, and broccoli.  Limit foods that are high in fat, such as fried foods and desserts.  Limit the amount of red meat and processed meat you eat, such as hot dogs, sausage, bacon, and lunch meats. General instructions  Keep all follow-up visits as told by your health care provider. This is important. ? This includes  having regularly scheduled colonoscopies. ? Talk to your health care provider about when you need a colonoscopy. Contact a health care provider if:  You have new or worsening bleeding during a bowel movement.  You have new or increased blood in your stool.  You have a change in bowel habits.  You lose weight for no known reason. Summary  Polyps are tissue growths inside the body. Polyps can grow in many places, including the colon.  Most colon polyps are noncancerous (benign), but some can become cancerous over time.  This condition is diagnosed with a colonoscopy.  Treatment for this condition involves removing any polyps that are found. Most polyps can be removed during a colonoscopy. This information is not intended to replace advice given to you by your health care provider. Make sure you discuss any questions you have with your health care provider. Document Released: 03/06/2004 Document Revised: 09/25/2017 Document Reviewed: 09/25/2017 Elsevier Interactive  Patient Education  2019 [employer]    Diverticulosis  Diverticulosis is a condition that develops when small pouches (diverticula) form in the wall of the large intestine (colon). The colon is where water is absorbed and stool is formed. The pouches form when the inside layer of the colon pushes through weak spots in the outer layers of the colon. You may have a few pouches or many of them. What are the causes? The cause of this condition is not known. What increases the risk? The following factors may make you more likely to develop this condition:  Being older than age 33. Your risk for this condition increases with age. Diverticulosis is rare among people younger than age 75. By age 60, many people have it.  Eating a low-fiber diet.  Having frequent constipation.  Being overweight.  Not getting enough exercise.  Smoking.  Taking over-the-counter pain medicines, like aspirin and ibuprofen.  Having a  family history of diverticulosis. What are the signs or symptoms? In most people, there are no symptoms of this condition. If you do have symptoms, they may include:  Bloating.  Cramps in the abdomen.  Constipation or diarrhea.  Pain in the lower left side of the abdomen. How is this diagnosed? This condition is most often diagnosed during an exam for other colon problems. Because diverticulosis usually has no symptoms, it often cannot be diagnosed independently. This condition may be diagnosed by:  Using a flexible scope to examine the colon (colonoscopy).  Taking an X-ray of the colon after dye has been put into the colon (barium enema).  Doing a CT scan. How is this treated? You may not need treatment for this condition if you have never developed an infection related to diverticulosis. If you have had an infection before, treatment may include:  Eating a high-fiber diet. This may include eating more fruits, vegetables, and grains.  Taking a fiber supplement.  Taking a live bacteria supplement (probiotic).  Taking medicine to relax your colon.  Taking antibiotic medicines. Follow these instructions at home:  Drink 6-8 glasses of water or more each day to prevent constipation.  Try not to strain when you have a bowel movement.  If you have had an infection before: ? Eat more fiber as directed by your health care provider or your diet and nutrition specialist (dietitian). ? Take a fiber supplement or probiotic, if your health care provider approves.  Take over-the-counter and prescription medicines only as told by your health care provider.  If you were prescribed an antibiotic, take it as told by your health care provider. Do not stop taking the antibiotic even if you start to feel better.  Keep all follow-up visits as told by your health care provider. This is important. Contact a health care provider if:  You have pain in your abdomen.  You have bloating.  You  have cramps.  You have not had a bowel movement in 3 days. Get help right away if:  Your pain gets worse.  Your bloating becomes very bad.  You have a fever or chills, and your symptoms suddenly get worse.  You vomit.  You have bowel movements that are bloody or black.  You have bleeding from your rectum. Summary  Diverticulosis is a condition that develops when small pouches (diverticula) form in the wall of the large intestine (colon).  You may have a few pouches or many of them.  This condition is most often diagnosed during an exam for other colon  problems.  If you have had an infection related to diverticulosis, treatment may include increasing the fiber in your diet, taking supplements, or taking medicines. This information is not intended to replace advice given to you by your health care provider. Make sure you discuss any questions you have with your health care provider. Document Released: 03/07/2004 Document Revised: 04/29/2016 Document Reviewed: 04/29/2016 Elsevier Interactive Patient Education  2019 [employer].

## 2018-07-01 NOTE — Op Note (Signed)
[facility] Patient Name: Joseph Dillon Procedure Date: 07/01/2018 8:09 AM MRN: [id number] Date of Birth: Oct 30, 1945 Attending MD: Norvel Richards , MD CSN: [id number] Age: 73 Admit Type: Outpatient Procedure:                Colonoscopy Indications:              Positive Cologuard test Providers:                Norvel Richards, MD, Lurline Del, RN, Nelma Rothman, Technician Referring MD:              Medicines:                Midazolam 10 mg IV, Meperidine 40 mg IV Complications:            No immediate complications. Estimated Blood Loss:     Estimated blood loss was minimal. Procedure:                Pre-Anesthesia Assessment:                           - Prior to the procedure, a History and Physical                            was performed, and patient medications and                            allergies were reviewed. The patient's tolerance of                            previous anesthesia was also reviewed. The risks                            and benefits of the procedure and the sedation                            options and risks were discussed with the patient.                            All questions were answered, and informed consent                            was obtained. Prior Anticoagulants: The patient has                            taken no previous anticoagulant or antiplatelet                            agents. ASA Grade Assessment: II - A patient with                            mild systemic disease. After reviewing the risks  and benefits, the patient was deemed in                            satisfactory condition to undergo the procedure.                           After obtaining informed consent, the colonoscope                            was passed under direct vision. Throughout the                            procedure, the patient's blood pressure, pulse, and                            oxygen  saturations were monitored continuously. The                            CF-HQ190L ([id number]) scope was introduced through                            the anus and advanced to the the cecum, identified                            by appendiceal orifice and ileocecal valve. The                            colonoscopy was performed without difficulty. The                            patient tolerated the procedure well. The quality                            of the bowel preparation was adequate. The                            ileocecal valve, appendiceal orifice, and rectum                            were photographed. Scope In: 8:40:42 AM Scope Out: 9:26:33 AM Scope Withdrawal Time: 0 hours 40 minutes 7 seconds  Total Procedure Duration: 0 hours 45 minutes 51 seconds  Findings:      The perianal and digital rectal examinations were normal.      Multiple medium-mouthed diverticula were found in the sigmoid colon and       descending colon.      A 30 x 41mm polyp was found in the descending colon. The polyp was       semi-pedunculated. Polyp lifted away from the colonic wall nicely with 4       cc of eleview. The polyp was removed with a hot snare -piecemeal       fashion. Probable tiny amount of residual polyp tissue at the       polypectomy margin ablated with the tip of the APC circular probe on       right  colon settings. Finally, polypectomy site marked with 3 cc of       spot. Estimated blood loss was minimal.      A 4 mm polyp was found in the splenic flexure. The polyp was sessile.       The polyp was removed with a cold snare. Resection and retrieval were       complete. 1 cm cecal AVM present. Remainder the colonic mucosa appeared       normal.      The retroflexed view of the distal rectum and anal verge was normal and       showed no anal or rectal abnormalities. Impression:               - Diverticulosis in the sigmoid colon and in the                            descending  colon.                           - One 30x20 mm polyp in the descending colon,                            removed with a hot snare. Resected and retrieved.                           - One 4 mm polyp at the splenic flexure, removed                            with a cold snare. Resected and retrieved.                           - The distal rectum and anal verge are normal on                            retroflexion view. Solitary cecal AVM. Moderate Sedation:      Moderate (conscious) sedation was administered by the endoscopy nurse       and supervised by the endoscopist. The following parameters were       monitored: oxygen saturation, heart rate, blood pressure, respiratory       rate, EKG, adequacy of pulmonary ventilation, and response to care.       Total physician intraservice time was 54 minutes. Recommendation:           - Patient has a contact number available for                            emergencies. The signs and symptoms of potential                            delayed complications were discussed with the                            patient. Return to normal activities tomorrow.                            Written discharge instructions were provided  to the                            patient.                           - Resume previous diet.                           - Continue present medications. No aspirin or                            NSAIDs x10 days                           - Repeat colonoscopy after studies are complete for                            surveillance based on pathology results.                           - Return to GI office in 6 months. Procedure Code(s):        --- Professional ---                           [phone], Colonoscopy, flexible; with removal of                            tumor(s), polyp(s), or other lesion(s) by snare                            technique                           99153, Moderate sedation; each additional 15                             minutes intraservice time                           99153, Moderate sedation; each additional 15                            minutes intraservice time                           99153, Moderate sedation; each additional 15                            minutes intraservice time                           G0500, Moderate sedation services provided by the                            same physician or other qualified health care  professional performing a gastrointestinal                            endoscopic service that sedation supports,                            requiring the presence of an independent trained                            observer to assist in the monitoring of the                            patient's level of consciousness and physiological                            status; initial 15 minutes of intra-service time;                            patient age 12 years or older (additional time may                            be reported with [id number], as appropriate) Diagnosis Code(s):        --- Professional ---                           D12.4, Benign neoplasm of descending colon                           D12.3, Benign neoplasm of transverse colon (hepatic                            flexure or splenic flexure)                           R19.5, Other fecal abnormalities                           K57.30, Diverticulosis of large intestine without                            perforation or abscess without bleeding CPT copyright 2018 American [employer] Association. All rights reserved. The codes documented in this report are preliminary and upon coder review may  be revised to meet current compliance requirements. Cristopher Estimable. Rourk, MD Norvel Richards, MD 07/01/2018 9:38:37 AM This report has been signed electronically. Number of Addenda: 0
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
## 2018-07-04 ENCOUNTER — Encounter: Payer: Self-pay | Admitting: Internal Medicine

## 2018-07-06 ENCOUNTER — Telehealth: Payer: Self-pay | Admitting: Family Medicine

## 2018-07-06 ENCOUNTER — Encounter (HOSPITAL_COMMUNITY): Payer: Self-pay | Admitting: Internal Medicine

## 2018-07-06 NOTE — Telephone Encounter (Signed)
Patient called in with c/o weakness to right arm and difficulty gripping items, and severe headache, and overall "weird feeling". He also had c/o swelling to left leg and foot that he believes is related to a tick bite. Patient informed me that he has a hx of stroke in the past. I advised patient that with headache, and difficulty gripping with right hand I recommend that he be evaluated at the ER for stroke protocol. Patient verbalized understanding and stated that he is at work and will go when he gets off. I advised patient that I do not recommend waiting until end of shift to go that I recommend going as soon as possible. Patient verbalized understanding and stated he would go to [place].

## 2018-07-08 ENCOUNTER — Telehealth: Payer: Self-pay

## 2018-07-08 NOTE — Telephone Encounter (Signed)
Reminder in epic °

## 2018-07-08 NOTE — Telephone Encounter (Signed)
Per RMR-  Send letter to patient.  Send copy of letter with path to referring provider and PCP.   Need ov in 6 mos

## 2018-08-14 ENCOUNTER — Other Ambulatory Visit: Payer: Self-pay | Admitting: Family Medicine

## 2018-11-12 ENCOUNTER — Other Ambulatory Visit: Payer: Self-pay | Admitting: Family Medicine

## 2018-12-21 ENCOUNTER — Encounter: Payer: Self-pay | Admitting: Internal Medicine

## 2019-01-11 ENCOUNTER — Other Ambulatory Visit: Payer: Self-pay

## 2019-01-14 ENCOUNTER — Ambulatory Visit (INDEPENDENT_AMBULATORY_CARE_PROVIDER_SITE_OTHER): Payer: Medicare Other | Admitting: Family Medicine

## 2019-01-14 ENCOUNTER — Encounter: Payer: Self-pay | Admitting: Family Medicine

## 2019-01-14 ENCOUNTER — Encounter (HOSPITAL_COMMUNITY): Payer: Self-pay | Admitting: Emergency Medicine

## 2019-01-14 ENCOUNTER — Other Ambulatory Visit: Payer: Self-pay

## 2019-01-14 ENCOUNTER — Emergency Department (HOSPITAL_COMMUNITY)
Admission: EM | Admit: 2019-01-14 | Discharge: 2019-01-14 | Disposition: A | Discharge status: Home / Self Care | Payer: Medicare Other | Attending: Emergency Medicine | Admitting: Emergency Medicine

## 2019-01-14 ENCOUNTER — Emergency Department (HOSPITAL_COMMUNITY): Payer: Medicare Other

## 2019-01-14 VITALS — BP 134/78 | HR 80 | Temp 99.1°F | Resp 16 | Ht 67.0 in | Wt 213.0 lb

## 2019-01-14 DIAGNOSIS — R0789 Other chest pain: Secondary | ICD-10-CM | POA: Insufficient documentation

## 2019-01-14 DIAGNOSIS — R600 Localized edema: Secondary | ICD-10-CM | POA: Insufficient documentation

## 2019-01-14 DIAGNOSIS — I10 Essential (primary) hypertension: Secondary | ICD-10-CM | POA: Diagnosis not present

## 2019-01-14 DIAGNOSIS — Z79899 Other long term (current) drug therapy: Secondary | ICD-10-CM | POA: Diagnosis not present

## 2019-01-14 DIAGNOSIS — Z20828 Contact with and (suspected) exposure to other viral communicable diseases: Secondary | ICD-10-CM | POA: Diagnosis not present

## 2019-01-14 DIAGNOSIS — R079 Chest pain, unspecified: Secondary | ICD-10-CM | POA: Diagnosis not present

## 2019-01-14 DIAGNOSIS — Z87891 Personal history of nicotine dependence: Secondary | ICD-10-CM | POA: Insufficient documentation

## 2019-01-14 LAB — CBC WITH DIFFERENTIAL/PLATELET
Abs Immature Granulocytes: 0.01 10*3/uL (ref 0.00–0.07)
Basophils Absolute: 0 10*3/uL (ref 0.0–0.1)
Basophils Relative: 0 %
Eosinophils Absolute: 0.1 10*3/uL (ref 0.0–0.5)
Eosinophils Relative: 1 %
HCT: 42.3 % (ref 39.0–52.0)
Hemoglobin: 14.6 g/dL (ref 13.0–17.0)
Immature Granulocytes: 0 %
Lymphocytes Relative: 26 %
Lymphs Abs: 1.8 10*3/uL (ref 0.7–4.0)
MCH: 29.7 pg (ref 26.0–34.0)
MCHC: 34.5 g/dL (ref 30.0–36.0)
MCV: 86 fL (ref 80.0–100.0)
Monocytes Absolute: 0.7 10*3/uL (ref 0.1–1.0)
Monocytes Relative: 10 %
Neutro Abs: 4.3 10*3/uL (ref 1.7–7.7)
Neutrophils Relative %: 63 %
Platelets: 233 10*3/uL (ref 150–400)
RBC: 4.92 MIL/uL (ref 4.22–5.81)
RDW: 12.2 % (ref 11.5–15.5)
WBC: 6.8 10*3/uL (ref 4.0–10.5)
nRBC: 0 % (ref 0.0–0.2)

## 2019-01-14 LAB — BASIC METABOLIC PANEL
Anion gap: 11 (ref 5–15)
BUN: 18 mg/dL (ref 8–23)
CO2: 25 mmol/L (ref 22–32)
Calcium: 9.4 mg/dL (ref 8.9–10.3)
Chloride: 102 mmol/L (ref 98–111)
Creatinine, Ser: 1.25 mg/dL — ABNORMAL HIGH (ref 0.61–1.24)
GFR calc Af Amer: >60 mL/min (ref >60)
GFR calc non Af Amer: 58 mL/min — ABNORMAL LOW (ref >60)
Glucose, Bld: 147 mg/dL — ABNORMAL HIGH (ref 70–99)
Potassium: 3.9 mmol/L (ref 3.5–5.1)
Sodium: 138 mmol/L (ref 135–145)

## 2019-01-14 LAB — D-DIMER, QUANTITATIVE: D-Dimer, Quant: 0.46 ug/mL-FEU (ref 0.00–0.50)

## 2019-01-14 LAB — TROPONIN I (HIGH SENSITIVITY)
Troponin I (High Sensitivity): <2 ng/L (ref <18)
Troponin I (High Sensitivity): 2 ng/L (ref <18)

## 2019-01-14 LAB — SARS CORONAVIRUS 2 BY RT PCR (HOSPITAL ORDER, PERFORMED IN ~~LOC~~ HOSPITAL LAB): SARS Coronavirus 2: NEGATIVE

## 2019-01-14 NOTE — Discharge Instructions (Signed)
I want you to follow-up with Dr Dennard Schaumann as soon as you can and discuss cardiac stress testing. Your testing today effectively ruled out a blood clot in your lungs and that you did not have a heart attack. Although it does not appear you had a heart attack, it does not rule out cardiac disease as an underlying cause of your symptoms.
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
## 2019-01-14 NOTE — ED Notes (Signed)
Patient transported to X-ray 

## 2019-01-14 NOTE — ED Triage Notes (Signed)
Cp x 2 weeks  Some sob no vomiting

## 2019-01-22 ENCOUNTER — Other Ambulatory Visit: Payer: Self-pay

## 2019-01-28 ENCOUNTER — Encounter: Payer: Medicare Other | Admitting: Family Medicine

## 2019-01-28 ENCOUNTER — Other Ambulatory Visit: Payer: Medicare Other

## 2019-01-28 ENCOUNTER — Other Ambulatory Visit: Payer: Self-pay

## 2019-01-28 DIAGNOSIS — E7800 Pure hypercholesterolemia, unspecified: Secondary | ICD-10-CM | POA: Diagnosis not present

## 2019-01-28 DIAGNOSIS — Z125 Encounter for screening for malignant neoplasm of prostate: Secondary | ICD-10-CM | POA: Diagnosis not present

## 2019-01-28 DIAGNOSIS — Z0000 Encounter for general adult medical examination without abnormal findings: Secondary | ICD-10-CM | POA: Diagnosis not present

## 2019-01-29 LAB — COMPLETE METABOLIC PANEL WITH GFR
AG Ratio: 2 (calc) (ref 1.0–2.5)
ALT: 31 U/L (ref 9–46)
AST: 25 U/L (ref 10–35)
Albumin: 4.7 g/dL (ref 3.6–5.1)
Alkaline phosphatase (APISO): 68 U/L (ref 35–144)
BUN/Creatinine Ratio: 20 (calc) (ref 6–22)
BUN: 27 mg/dL — ABNORMAL HIGH (ref 7–25)
CO2: 30 mmol/L (ref 20–32)
Calcium: 10.1 mg/dL (ref 8.6–10.3)
Chloride: 99 mmol/L (ref 98–110)
Creat: 1.35 mg/dL — ABNORMAL HIGH (ref 0.70–1.18)
GFR, Est African American: 61 mL/min/{1.73_m2} (ref >=60)
GFR, Est Non African American: 52 mL/min/{1.73_m2} — ABNORMAL LOW (ref >=60)
Globulin: 2.3 g/dL (calc) (ref 1.9–3.7)
Glucose, Bld: 114 mg/dL — ABNORMAL HIGH (ref 65–99)
Potassium: 4.6 mmol/L (ref 3.5–5.3)
Sodium: 139 mmol/L (ref 135–146)
Total Bilirubin: 0.7 mg/dL (ref 0.2–1.2)
Total Protein: 7 g/dL (ref 6.1–8.1)

## 2019-01-29 LAB — LIPID PANEL
Cholesterol: 139 mg/dL (ref <200)
HDL: 52 mg/dL (ref >=40)
LDL Cholesterol (Calc): 73 mg/dL (calc)
Non-HDL Cholesterol (Calc): 87 mg/dL (calc) (ref <130)
Total CHOL/HDL Ratio: 2.7 (calc) (ref <5.0)
Triglycerides: 65 mg/dL (ref <150)

## 2019-01-29 LAB — PSA: PSA: 2.9 ng/mL (ref <=4.0)

## 2019-01-29 LAB — EXTRA LAV TOP TUBE

## 2019-02-11 ENCOUNTER — Ambulatory Visit (INDEPENDENT_AMBULATORY_CARE_PROVIDER_SITE_OTHER): Payer: Medicare Other | Admitting: Nurse Practitioner

## 2019-02-11 ENCOUNTER — Other Ambulatory Visit: Payer: Self-pay

## 2019-02-11 ENCOUNTER — Encounter: Payer: Self-pay | Admitting: Nurse Practitioner

## 2019-02-11 ENCOUNTER — Other Ambulatory Visit: Payer: Self-pay | Admitting: Family Medicine

## 2019-02-11 ENCOUNTER — Telehealth: Payer: Self-pay

## 2019-02-11 DIAGNOSIS — Z86010 Personal history of colonic polyps: Secondary | ICD-10-CM | POA: Diagnosis not present

## 2019-02-11 DIAGNOSIS — Z860101 Personal history of adenomatous and serrated colon polyps: Secondary | ICD-10-CM | POA: Insufficient documentation
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
## 2019-02-11 NOTE — Telephone Encounter (Signed)
Pt was seen today by EG. Needs TCS for history of polyps. He wants to wait until December to have TCS. Can he be triaged or does he need another OV prior to being scheduled for December?

## 2019-02-11 NOTE — Patient Instructions (Signed)
Your health issues we discussed today were:   Need for colonoscopy: 1. We need to repeat your colonoscopy to make sure that the entire polyp was removed 2. Further recommendations will be made after your procedure  Overall I recommend:  1. Continue your current medications 2. Return for follow-up based on recommendations made after your colonoscopy 3. Call us if you have any questions or concerns.   Because of recent events of COVID-19 ("Coronavirus"), follow CDC recommendations:  1. Wash your hand frequently 2. Avoid touching your face 3. Stay away from people who are sick 4. If you have symptoms such as fever, cough, shortness of breath then call your healthcare provider for further guidance 5. If you are sick, STAY AT HOME unless otherwise directed by your healthcare provider. 6. Follow directions from state and national officials regarding staying safe   At [facility] we value your feedback. You may receive a survey about your visit today. Please share your experience as we strive to create trusting relationships with our patients to provide genuine, compassionate, quality care.  We appreciate your understanding and patience as we review any laboratory studies, imaging, and other diagnostic tests that are ordered as we care for you. Our office policy is 5 business days for review of these results, and any emergent or urgent results are addressed in a timely manner for your best interest. If you do not hear from our office in 1 week, please contact us.   We also encourage the use of MyChart, which contains your medical information for your review as well. If you are not enrolled in this feature, an access code is on this after visit summary for your convenience. Thank you for allowing [place] to be involved in your care.  It was great to see you today!  I hope you have a great summer!!

## 2019-02-11 NOTE — Assessment & Plan Note (Signed)
Recent colonoscopy which found 2 polyps.  1 of these polyps was very large measuring 20 mm x 30 mm.  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  Recommended repeat colonoscopy in 6 months to ensure entire polyp removed.  We will proceed with scheduling at this time.  Proceed with TCS with Dr. Gala Romney in near future: the risks, benefits, and alternatives have been discussed with the patient in detail. The patient states understanding and desires to proceed.  The patient is not on any anticoagulants, anxiolytics, chronic pain medications, or antidepressants.  Conscious sedation should be adequate for his procedure as it was for his last.

## 2019-02-14 NOTE — Telephone Encounter (Signed)
no

## 2019-02-15 NOTE — Telephone Encounter (Signed)
Noted, will contact pt when December schedule is available.

## 2019-02-15 NOTE — Progress Notes (Signed)
cc'ed to pcp °

## 2019-02-18 ENCOUNTER — Encounter: Payer: Self-pay | Admitting: Family Medicine

## 2019-02-18 ENCOUNTER — Ambulatory Visit (INDEPENDENT_AMBULATORY_CARE_PROVIDER_SITE_OTHER): Payer: Medicare Other | Admitting: Family Medicine

## 2019-02-18 ENCOUNTER — Other Ambulatory Visit: Payer: Self-pay

## 2019-02-18 ENCOUNTER — Other Ambulatory Visit: Payer: Medicare Other

## 2019-02-18 VITALS — BP 140/78 | HR 86 | Temp 99.2°F | Resp 16 | Ht 67.0 in | Wt 212.0 lb

## 2019-02-18 DIAGNOSIS — I10 Essential (primary) hypertension: Secondary | ICD-10-CM

## 2019-02-18 DIAGNOSIS — Z0000 Encounter for general adult medical examination without abnormal findings: Secondary | ICD-10-CM

## 2019-02-18 DIAGNOSIS — Z0001 Encounter for general adult medical examination with abnormal findings: Secondary | ICD-10-CM

## 2019-02-18 DIAGNOSIS — R079 Chest pain, unspecified: Secondary | ICD-10-CM | POA: Diagnosis not present

## 2019-02-18 DIAGNOSIS — E7800 Pure hypercholesterolemia, unspecified: Secondary | ICD-10-CM

## 2019-02-18 DIAGNOSIS — Z23 Encounter for immunization: Secondary | ICD-10-CM | POA: Diagnosis not present

## 2019-02-18 MED ORDER — PANTOPRAZOLE SODIUM 40 MG PO TBEC: 40.0000 mg | DELAYED_RELEASE_TABLET | Freq: Every day | ORAL | 3 refills | Status: DC

## 2019-02-18 MED ORDER — ISOSORBIDE MONONITRATE ER 30 MG PO TB24: 30.0000 mg | ORAL_TABLET | Freq: Every day | ORAL | 6 refills | Status: DC
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
## 2019-03-11 ENCOUNTER — Other Ambulatory Visit: Payer: Self-pay | Admitting: *Deleted

## 2019-03-11 NOTE — Telephone Encounter (Signed)
Called pt, lmtcb

## 2019-03-15 ENCOUNTER — Encounter: Payer: Self-pay | Admitting: *Deleted

## 2019-03-23 ENCOUNTER — Telehealth: Payer: Self-pay | Admitting: Internal Medicine

## 2019-03-23 NOTE — Telephone Encounter (Signed)
OPENED IN ERROR

## 2019-03-23 NOTE — Telephone Encounter (Signed)
PATIENT CALLED TO SCHEDULE HIS TCS, WHEN I ASKED HIM FOR A NUMBER THAT THE SCHEDULERS COULD CALL HIM AT HE SAID "THERE IS NONE"-HE CALLED FROM WORK AND SAID HE WILL CALL BACK TOMORROW BECAUSE  HE CAN'T BE CALLED

## 2019-03-23 NOTE — Telephone Encounter (Signed)
Noted. Will await patient to call back

## 2019-03-24 ENCOUNTER — Other Ambulatory Visit: Payer: Self-pay | Admitting: *Deleted

## 2019-03-24 DIAGNOSIS — Z86010 Personal history of colonic polyps: Secondary | ICD-10-CM

## 2019-03-24 DIAGNOSIS — Z860101 Personal history of adenomatous and serrated colon polyps: Secondary | ICD-10-CM

## 2019-03-24 MED ORDER — NA SULFATE-K SULFATE-MG SULF 17.5-3.13-1.6 GM/177ML PO SOLN: 1.0000 | Freq: Once | ORAL | 0 refills | Status: AC

## 2019-03-24 NOTE — Telephone Encounter (Signed)
Pt called office, pt wanted to do TCS on Thurs or Fri. Informed pt RMR doesn't do TCS on Thurs (d/t Propofol day) and no Fri available in December. Pt states he will think about it and call back.

## 2019-03-24 NOTE — Addendum Note (Signed)
Addended by: Cheron Every on: 03/24/2019 04:17 PM   Modules accepted: Orders

## 2019-03-24 NOTE — Telephone Encounter (Signed)
Patient called back. He states he does not have a working #. He called from work. He has scheduled procedure for 12/2 at 8:30am with RMR. Patient aware arrival time 7:30am. I advised will mail instructions with covid-19 appt. Confirmed mailing address is correct. Rx sent to pharmacy and patient aware to pick up and hold on too it. Orders entered.

## 2019-04-01 ENCOUNTER — Telehealth: Payer: Self-pay | Admitting: *Deleted

## 2019-04-01 NOTE — Telephone Encounter (Signed)
Received a call from Justice in endo patient was called and "cussing" that he had to have COVID-19 test done before his procedure and if he had the covid-19 test done he was still going to work. Kim informed him if he did that then his procedure would be cancelled. She stated patient was mumbling something before he hung up on her.  Patient called the office and stated he wanted to cancel his procedure. He advised me that his insurance will not cover the "whole thing" and he isn't going to pay for it. He did not want to r/s. Called endo and aware to cancel.

## 2019-04-02 NOTE — Telephone Encounter (Signed)
Noted. CC: CM for FYI related to behavior.

## 2019-04-06 NOTE — Telephone Encounter (Signed)
Reviewed routing to RMR for review

## 2019-04-07 ENCOUNTER — Encounter: Payer: Self-pay | Admitting: Internal Medicine

## 2019-04-08 ENCOUNTER — Encounter: Payer: Self-pay | Admitting: Internal Medicine

## 2019-04-08 NOTE — Progress Notes (Signed)
.  rmr 

## 2019-04-08 NOTE — Telephone Encounter (Signed)
See letter to patient.

## 2019-04-09 NOTE — Telephone Encounter (Signed)
Letter mailed

## 2019-04-15 ENCOUNTER — Ambulatory Visit (INDEPENDENT_AMBULATORY_CARE_PROVIDER_SITE_OTHER): Payer: Medicare Other | Admitting: Cardiology

## 2019-04-15 ENCOUNTER — Other Ambulatory Visit: Payer: Self-pay

## 2019-04-15 ENCOUNTER — Encounter: Payer: Self-pay | Admitting: Cardiology

## 2019-04-15 VITALS — BP 127/77 | HR 87 | Temp 96.8°F | Ht 70.0 in | Wt 215.0 lb

## 2019-04-15 DIAGNOSIS — R0789 Other chest pain: Secondary | ICD-10-CM | POA: Diagnosis not present

## 2019-04-15 DIAGNOSIS — R079 Chest pain, unspecified: Secondary | ICD-10-CM

## 2019-04-15 DIAGNOSIS — I10 Essential (primary) hypertension: Secondary | ICD-10-CM | POA: Diagnosis not present

## 2019-04-15 DIAGNOSIS — E782 Mixed hyperlipidemia: Secondary | ICD-10-CM

## 2019-04-15 DIAGNOSIS — R0602 Shortness of breath: Secondary | ICD-10-CM

## 2019-04-15 NOTE — Patient Instructions (Signed)
Medication Instructions:  Your physician recommends that you continue on your current medications as directed. Please refer to the Current Medication list given to you today.  *If you need a refill on your cardiac medications before your next appointment, please call your pharmacy*  Lab Work: None today If you have labs (blood work) drawn today and your tests are completely normal, you will receive your results only by: Marland Kitchen MyChart Message (if you have MyChart) OR . A paper copy in the mail If you have any lab test that is abnormal or we need to change your treatment, we will call you to review the results.  Testing/Procedures: Your physician has requested that you have an echocardiogram. Echocardiography is a painless test that uses sound waves to create images of your heart. It provides your doctor with information about the size and shape of your heart and how well your heart's chambers and valves are working. This procedure takes approximately one hour. There are no restrictions for this procedure.  Your physician has requested that you have a lexiscan myoview. For further information please visit [email or web address]. Please follow instruction sheet, as given.    Follow-Up: At [facility], you and your health needs are our priority.  As part of our continuing mission to provide you with exceptional heart care, we have created designated Provider Care Teams.  These Care Teams include your primary Cardiologist (physician) and Advanced Practice Providers (APPs -  Physician Assistants and Nurse Practitioners) who all work together to provide you with the care you need, when you need it.  Your next appointment:   We will call you with test results.     Thank you for choosing [facility] !
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
## 2019-04-28 ENCOUNTER — Encounter (HOSPITAL_BASED_OUTPATIENT_CLINIC_OR_DEPARTMENT_OTHER)
Admission: RE | Admit: 2019-04-28 | Discharge: 2019-04-28 | Disposition: A | Discharge status: Home / Self Care | Payer: Medicare Other | Source: Ambulatory Visit | Attending: Cardiology | Admitting: Cardiology

## 2019-04-28 ENCOUNTER — Ambulatory Visit (HOSPITAL_COMMUNITY)
Admission: RE | Admit: 2019-04-28 | Discharge: 2019-04-28 | Disposition: A | Discharge status: Home / Self Care | Payer: Medicare Other | Source: Ambulatory Visit | Attending: Cardiology | Admitting: Cardiology

## 2019-04-28 ENCOUNTER — Other Ambulatory Visit: Payer: Self-pay

## 2019-04-28 ENCOUNTER — Encounter (HOSPITAL_COMMUNITY)
Admission: RE | Admit: 2019-04-28 | Discharge: 2019-04-28 | Disposition: A | Discharge status: Home / Self Care | Payer: Medicare Other | Source: Ambulatory Visit | Attending: Cardiology | Admitting: Cardiology

## 2019-04-28 DIAGNOSIS — R0602 Shortness of breath: Secondary | ICD-10-CM

## 2019-04-28 DIAGNOSIS — R079 Chest pain, unspecified: Secondary | ICD-10-CM | POA: Insufficient documentation

## 2019-04-28 LAB — NM MYOCAR MULTI W/SPECT W/WALL MOTION / EF
LV dias vol: 76 mL (ref 62–150)
LV sys vol: 31 mL
Peak HR: 88 {beats}/min
RATE: 0.34
Rest HR: 62 {beats}/min
SDS: 4
SRS: 0
SSS: 4
TID: 1.08

## 2019-04-28 MED ORDER — SODIUM CHLORIDE FLUSH 0.9 % IV SOLN
INTRAVENOUS | Status: AC
  Administered 2019-04-28: 10 mL via INTRAVENOUS
  Filled 2019-04-28: qty 10

## 2019-04-28 MED ORDER — TECHNETIUM TC 99M TETROFOSMIN IV KIT
30.0000 | PACK | Freq: Once | INTRAVENOUS | Status: AC | PRN
  Administered 2019-04-28: 32 via INTRAVENOUS

## 2019-04-28 MED ORDER — TECHNETIUM TC 99M TETROFOSMIN IV KIT
10.0000 | PACK | Freq: Once | INTRAVENOUS | Status: AC | PRN
  Administered 2019-04-28: 10.7 via INTRAVENOUS

## 2019-04-28 MED ORDER — REGADENOSON 0.4 MG/5ML IV SOLN
INTRAVENOUS | Status: AC
  Administered 2019-04-28: 5 mL via INTRAVENOUS
  Filled 2019-04-28: qty 5

## 2019-04-28 NOTE — Progress Notes (Signed)
*  PRELIMINARY RESULTS* Echocardiogram 2D Echocardiogram has been performed.  Joseph Dillon 04/28/2019, 9:20 AM

## 2019-05-06 ENCOUNTER — Other Ambulatory Visit: Payer: Self-pay

## 2019-05-06 ENCOUNTER — Ambulatory Visit (INDEPENDENT_AMBULATORY_CARE_PROVIDER_SITE_OTHER): Payer: Medicare Other | Admitting: Gastroenterology

## 2019-05-06 ENCOUNTER — Encounter: Payer: Self-pay | Admitting: Gastroenterology

## 2019-05-06 VITALS — BP 112/74 | HR 85 | Temp 97.0°F | Ht 67.0 in | Wt 215.6 lb

## 2019-05-06 DIAGNOSIS — Z86010 Personal history of colonic polyps: Secondary | ICD-10-CM | POA: Diagnosis not present

## 2019-05-06 MED ORDER — PEG 3350-KCL-NA BICARB-NACL 420 G PO SOLR: 4000.0000 mL | ORAL | 0 refills | Status: DC
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
## 2019-05-06 NOTE — Progress Notes (Signed)
CC'ED TO PCP 

## 2019-05-06 NOTE — Patient Instructions (Signed)
We have scheduled you for a colonoscopy in the near future with Dr. Gala Romney.  Further recommendations to follow!  It was a pleasure to see you today. I want to create trusting relationships with patients to provide genuine, compassionate, and quality care. I value your feedback. If you receive a survey regarding your visit,  I greatly appreciate you taking time to fill this out.   Annitta Needs, PhD, ANP-BC [facility] Gastroenterology

## 2019-05-24 ENCOUNTER — Other Ambulatory Visit (HOSPITAL_COMMUNITY): Payer: Medicare Other

## 2019-05-26 ENCOUNTER — Ambulatory Visit (HOSPITAL_COMMUNITY): Admit: 2019-05-26 | Payer: Medicare Other | Admitting: Internal Medicine

## 2019-05-26 ENCOUNTER — Encounter (HOSPITAL_COMMUNITY): Payer: Self-pay

## 2019-05-26 SURGERY — COLONOSCOPY
Anesthesia: Moderate Sedation

## 2019-05-31 ENCOUNTER — Other Ambulatory Visit: Payer: Self-pay | Admitting: Family Medicine

## 2019-05-31 MED ORDER — PANTOPRAZOLE SODIUM 40 MG PO TBEC: 40.0000 mg | DELAYED_RELEASE_TABLET | Freq: Every day | ORAL | 3 refills | Status: DC

## 2019-07-05 ENCOUNTER — Telehealth: Payer: Self-pay

## 2019-07-05 NOTE — Telephone Encounter (Signed)
LMOVM to inform endo scheduler to cancel TCS.

## 2019-07-05 NOTE — Telephone Encounter (Signed)
TCS scheduled for 07/23/19 has to be cancelled d/t surge in [place] cases. Tried to call pt, no answer, LMOVM for return call. Letter mailed to pt to inform him procedure has been cancelled.

## 2019-07-21 ENCOUNTER — Encounter: Payer: Self-pay | Admitting: *Deleted

## 2019-07-21 ENCOUNTER — Telehealth: Payer: Self-pay | Admitting: *Deleted

## 2019-07-21 NOTE — Telephone Encounter (Signed)
Lmom for pt to call us back and mailed letter to pt.  Need to reschedule his procedure.

## 2019-07-22 ENCOUNTER — Other Ambulatory Visit (HOSPITAL_COMMUNITY): Payer: Medicare Other

## 2019-07-23 ENCOUNTER — Encounter (HOSPITAL_COMMUNITY): Payer: Self-pay

## 2019-07-23 ENCOUNTER — Ambulatory Visit (HOSPITAL_COMMUNITY): Admit: 2019-07-23 | Payer: Medicare Other | Admitting: Internal Medicine

## 2019-07-23 SURGERY — COLONOSCOPY
Anesthesia: Moderate Sedation

## 2019-07-30 ENCOUNTER — Telehealth: Payer: Self-pay | Admitting: Internal Medicine

## 2019-07-30 NOTE — Telephone Encounter (Signed)
Pt said he received a letter to schedule his colonoscopy and said he would rather wait until he gets his covid shot.

## 2019-07-30 NOTE — Telephone Encounter (Signed)
Noted  

## 2019-08-05 ENCOUNTER — Other Ambulatory Visit: Payer: Self-pay | Admitting: Family Medicine

## 2019-08-14 ENCOUNTER — Ambulatory Visit: Payer: Medicare Other | Attending: Internal Medicine

## 2019-08-14 DIAGNOSIS — Z23 Encounter for immunization: Secondary | ICD-10-CM | POA: Insufficient documentation

## 2019-08-14 NOTE — Progress Notes (Signed)
   Covid-19 Vaccination Clinic  Name:  Joseph Dillon    MRN: [id number] DOB: 1946-02-16  08/14/2019  [name] was observed post Covid-19 immunization for 15 minutes without incidence. He was provided with Vaccine Information Sheet and instruction to access the V-Safe system.   [name] was instructed to call 911 with any severe reactions post vaccine: Marland Kitchen Difficulty breathing  . Swelling of your face and throat  . A fast heartbeat  . A bad rash all over your body  . Dizziness and weakness    Immunizations Administered    Name Date Dose VIS Date Route   Pfizer COVID-19 Vaccine 08/14/2019  3:37 PM 0.3 mL 06/04/2019 Intramuscular   Manufacturer: [place]   Lot: [id number]   [place]: [id number]

## 2019-08-24 ENCOUNTER — Telehealth: Payer: Self-pay | Admitting: Internal Medicine

## 2019-08-24 NOTE — Telephone Encounter (Signed)
LMOVM for pt 

## 2019-08-24 NOTE — Telephone Encounter (Signed)
Pt was calling to reschedule his colonoscopy. He said he has had his first covid shot. He was seen in the office in 04/2019 and cancelled his procedure because of covid testing. I offered him an OV for mid March and he is declining to come in because of his work schedule and he can't take calls at work. Please advise if he can be triaged or if he needs OV prior to scheduling. [phone]

## 2019-08-25 MED ORDER — PEG 3350-KCL-NA BICARB-NACL 420 G PO SOLR: ORAL | 0 refills | Status: DC

## 2019-08-25 NOTE — Telephone Encounter (Signed)
Patient called back. He has been scheduled for TCS with RMR 5/14 at 9:30am, aware arrival 8:30am. Aware will mail prep instructions with his COVID testing appt. Rx sent to pharmacy. Orders entered.

## 2019-08-25 NOTE — Telephone Encounter (Signed)
LM with male to call back

## 2019-09-10 ENCOUNTER — Ambulatory Visit: Payer: Medicare Other

## 2019-09-11 ENCOUNTER — Ambulatory Visit: Payer: Medicare Other

## 2019-09-18 ENCOUNTER — Ambulatory Visit: Payer: Medicare Other | Attending: Internal Medicine

## 2019-09-18 DIAGNOSIS — Z23 Encounter for immunization: Secondary | ICD-10-CM

## 2019-09-18 NOTE — Progress Notes (Signed)
   Covid-19 Vaccination Clinic  Name:  Joseph Dillon    MRN: [id number] DOB: 19-Oct-1945  09/18/2019  [name] was observed post Covid-19 immunization for 15 minutes without incident. He was provided with Vaccine Information Sheet and instruction to access the V-Safe system.   [name] was instructed to call 911 with any severe reactions post vaccine: Marland Kitchen Difficulty breathing  . Swelling of face and throat  . A fast heartbeat  . A bad rash all over body  . Dizziness and weakness   Immunizations Administered    Name Date Dose VIS Date Route   Pfizer COVID-19 Vaccine 09/18/2019  4:34 PM 0.3 mL 06/04/2019 Intramuscular   Manufacturer: [place]   Lot: [id number]   [place]: [id number]

## 2019-10-29 ENCOUNTER — Other Ambulatory Visit: Payer: Self-pay | Admitting: Family Medicine

## 2019-11-02 ENCOUNTER — Other Ambulatory Visit (HOSPITAL_COMMUNITY): Payer: Medicare Other

## 2019-11-04 ENCOUNTER — Other Ambulatory Visit: Payer: Self-pay

## 2019-11-04 ENCOUNTER — Other Ambulatory Visit (HOSPITAL_COMMUNITY)
Admission: RE | Admit: 2019-11-04 | Discharge: 2019-11-04 | Disposition: A | Discharge status: Home / Self Care | Payer: Medicare Other | Source: Ambulatory Visit | Attending: Internal Medicine | Admitting: Internal Medicine

## 2019-11-04 DIAGNOSIS — Z01812 Encounter for preprocedural laboratory examination: Secondary | ICD-10-CM | POA: Insufficient documentation

## 2019-11-04 DIAGNOSIS — Z20822 Contact with and (suspected) exposure to covid-19: Secondary | ICD-10-CM | POA: Diagnosis not present

## 2019-11-04 LAB — SARS CORONAVIRUS 2 BY RT PCR (HOSPITAL ORDER, PERFORMED IN ~~LOC~~ HOSPITAL LAB): SARS Coronavirus 2: NEGATIVE

## 2019-11-05 ENCOUNTER — Ambulatory Visit (HOSPITAL_COMMUNITY)
Admission: RE | Admit: 2019-11-05 | Discharge: 2019-11-05 | Disposition: A | Discharge status: Home / Self Care | Payer: Medicare Other | Attending: Internal Medicine | Admitting: Internal Medicine

## 2019-11-05 ENCOUNTER — Encounter (HOSPITAL_COMMUNITY)
Admission: RE | Disposition: A | Discharge status: Home / Self Care | Payer: Self-pay | Source: Home / Self Care | Attending: Internal Medicine

## 2019-11-05 ENCOUNTER — Encounter (HOSPITAL_COMMUNITY): Payer: Self-pay | Admitting: Internal Medicine

## 2019-11-05 DIAGNOSIS — D123 Benign neoplasm of transverse colon: Secondary | ICD-10-CM | POA: Diagnosis not present

## 2019-11-05 DIAGNOSIS — Z1211 Encounter for screening for malignant neoplasm of colon: Secondary | ICD-10-CM | POA: Diagnosis not present

## 2019-11-05 DIAGNOSIS — Z79899 Other long term (current) drug therapy: Secondary | ICD-10-CM | POA: Diagnosis not present

## 2019-11-05 DIAGNOSIS — K621 Rectal polyp: Secondary | ICD-10-CM | POA: Diagnosis not present

## 2019-11-05 DIAGNOSIS — Z880 Allergy status to penicillin: Secondary | ICD-10-CM | POA: Diagnosis not present

## 2019-11-05 DIAGNOSIS — E785 Hyperlipidemia, unspecified: Secondary | ICD-10-CM | POA: Diagnosis not present

## 2019-11-05 DIAGNOSIS — Z86010 Personal history of colonic polyps: Secondary | ICD-10-CM | POA: Diagnosis not present

## 2019-11-05 DIAGNOSIS — I10 Essential (primary) hypertension: Secondary | ICD-10-CM | POA: Diagnosis not present

## 2019-11-05 DIAGNOSIS — Z888 Allergy status to other drugs, medicaments and biological substances status: Secondary | ICD-10-CM | POA: Insufficient documentation

## 2019-11-05 DIAGNOSIS — M1990 Unspecified osteoarthritis, unspecified site: Secondary | ICD-10-CM | POA: Insufficient documentation

## 2019-11-05 DIAGNOSIS — Z8673 Personal history of transient ischemic attack (TIA), and cerebral infarction without residual deficits: Secondary | ICD-10-CM | POA: Diagnosis not present

## 2019-11-05 DIAGNOSIS — K5730 Diverticulosis of large intestine without perforation or abscess without bleeding: Secondary | ICD-10-CM | POA: Diagnosis not present

## 2019-11-05 DIAGNOSIS — Z7982 Long term (current) use of aspirin: Secondary | ICD-10-CM | POA: Insufficient documentation

## 2019-11-05 DIAGNOSIS — D128 Benign neoplasm of rectum: Secondary | ICD-10-CM | POA: Diagnosis not present

## 2019-11-05 DIAGNOSIS — R0600 Dyspnea, unspecified: Secondary | ICD-10-CM | POA: Diagnosis not present

## 2019-11-05 DIAGNOSIS — K635 Polyp of colon: Secondary | ICD-10-CM

## 2019-11-05 DIAGNOSIS — K219 Gastro-esophageal reflux disease without esophagitis: Secondary | ICD-10-CM | POA: Diagnosis not present

## 2019-11-05 DIAGNOSIS — Z87891 Personal history of nicotine dependence: Secondary | ICD-10-CM | POA: Diagnosis not present

## 2019-11-05 HISTORY — DX: Headache, unspecified: R51.9

## 2019-11-05 HISTORY — DX: Unspecified osteoarthritis, unspecified site: M19.90

## 2019-11-05 HISTORY — PX: POLYPECTOMY: SHX5525

## 2019-11-05 HISTORY — DX: Dyspnea, unspecified: R06.00

## 2019-11-05 HISTORY — PX: COLONOSCOPY: SHX5424

## 2019-11-05 SURGERY — COLONOSCOPY
Anesthesia: Moderate Sedation

## 2019-11-05 MED ORDER — MIDAZOLAM HCL 5 MG/5ML IJ SOLN
INTRAMUSCULAR | Status: AC
  Filled 2019-11-05: qty 10

## 2019-11-05 MED ORDER — ONDANSETRON HCL 4 MG/2ML IJ SOLN
INTRAMUSCULAR | Status: DC | PRN
  Administered 2019-11-05: 4 mg via INTRAVENOUS

## 2019-11-05 MED ORDER — MEPERIDINE HCL 100 MG/ML IJ SOLN
INTRAMUSCULAR | Status: DC | PRN
  Administered 2019-11-05: 15 mg
  Administered 2019-11-05: 25 mg

## 2019-11-05 MED ORDER — SODIUM CHLORIDE 0.9 % IV SOLN
INTRAVENOUS | Status: DC
  Administered 2019-11-05: 1000 mL via INTRAVENOUS

## 2019-11-05 MED ORDER — MEPERIDINE HCL 50 MG/ML IJ SOLN
INTRAMUSCULAR | Status: AC
  Filled 2019-11-05: qty 1

## 2019-11-05 MED ORDER — MIDAZOLAM HCL 5 MG/5ML IJ SOLN
INTRAMUSCULAR | Status: DC | PRN
  Administered 2019-11-05: 2 mg via INTRAVENOUS
  Administered 2019-11-05: 1 mg via INTRAVENOUS
  Administered 2019-11-05: 2 mg via INTRAVENOUS

## 2019-11-05 MED ORDER — ONDANSETRON HCL 4 MG/2ML IJ SOLN
INTRAMUSCULAR | Status: AC
  Filled 2019-11-05: qty 2
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
## 2019-11-05 NOTE — Op Note (Addendum)
[facility] Patient Name: Joseph Dillon Procedure Date: 11/05/2019 9:00 AM MRN: [id number] Date of Birth: May 13, 1946 Attending MD: Norvel Richards , MD CSN: [id number] Age: 74 Admit Type: Outpatient Procedure:                Colonoscopy Indications:              High risk colon cancer surveillance: Personal                            history of colonic polyps Providers:                Norvel Richards, MD, Janeece Riggers, RN, Raphael Gibney, Technician Referring MD:              Medicines:                Midazolam 5 mg IV, Meperidine 40 mg IV Complications:            No immediate complications. Estimated Blood Loss:     Estimated blood loss was minimal. Procedure:                Pre-Anesthesia Assessment:                           - Prior to the procedure, a History and Physical                            was performed, and patient medications and                            allergies were reviewed. The patient's tolerance of                            previous anesthesia was also reviewed. The risks                            and benefits of the procedure and the sedation                            options and risks were discussed with the patient.                            All questions were answered, and informed consent                            was obtained. Prior Anticoagulants: The patient has                            taken no previous anticoagulant or antiplatelet                            agents. ASA Grade Assessment: II - A patient with  mild systemic disease. After reviewing the risks                            and benefits, the patient was deemed in                            satisfactory condition to undergo the procedure.                           After obtaining informed consent, the colonoscope                            was passed under direct vision. Throughout the   procedure, the patient's blood pressure, pulse, and                            oxygen saturations were monitored continuously. The                            [id number] scope was introduced through                            the anus and advanced to the the cecum, identified                            by appendiceal orifice and ileocecal valve. The                            colonoscopy was performed without difficulty. The                            patient tolerated the procedure well. The quality                            of the bowel preparation was adequate. Scope In: 9:26:22 AM Scope Out: 9:50:30 AM Scope Withdrawal Time: 0 hours 16 minutes 9 seconds  Total Procedure Duration: 0 hours 24 minutes 8 seconds  Findings:      The perianal and digital rectal examinations were normal.      Scattered medium-mouthed diverticula were found in the entire colon.      A 5 mm polyp was found in the hepatic flexure. The polyp was       semi-pedunculated. The polyp was removed with a cold snare. Resection       and retrieval were complete. Estimated blood loss was minimal.      A 10 mm polyp was found in the rectum. The polyp was semi-pedunculated.       The polyp was removed with a hot snare. Resection and retrieval were       complete. Estimated blood loss: none.      The exam was otherwise without abnormality on direct and retroflexion       views. Ink spot and descending colon readily identified. No evidence of       residual large polyp removed previously Impression:               - Diverticulosis in the entire  examined colon.                           - One 5 mm polyp at the hepatic flexure, removed                            with a cold snare. Resected and retrieved.                           - One 10 mm polyp in the rectum, removed with a hot                            snare. Resected and retrieved.                           - The examination was otherwise normal on direct                             and retroflexion views. Moderate Sedation:      Moderate (conscious) sedation was personally administered by an       anesthesia professional. The following parameters were monitored: oxygen       saturation, heart rate, blood pressure, respiratory rate, EKG, adequacy       of pulmonary ventilation, and response to care. Total physician       intraservice time was 35 minutes. Recommendation:           - Written discharge instructions were provided to                            the patient.                           - The signs and symptoms of potential delayed                            complications were discussed with the patient.                           - Patient has a contact number available for                            emergencies.                           - Return to normal activities tomorrow.                           - Resume previous diet.                           - Continue present medications.                           - Repeat colonoscopy date to be determined after  pending pathology results are reviewed for                            surveillance.                           - Return to GI office (date not yet determined). Procedure Code(s):        --- Professional ---                           [id number], Colonoscopy, flexible; with removal of                            tumor(s), polyp(s), or other lesion(s) by snare                            technique Diagnosis Code(s):        --- Professional ---                           Z86.010, Personal history of colonic polyps                           K63.5, Polyp of colon                           K62.1, Rectal polyp                           K57.30, Diverticulosis of large intestine without                            perforation or abscess without bleeding CPT copyright 2019 American Medical Association. All rights reserved. The codes documented in this report are preliminary and  upon coder review may  be revised to meet current compliance requirements. Cristopher Estimable. Eliabeth Shoff, MD Norvel Richards, MD 11/05/2019 10:00:49 AM This report has been signed electronically. Number of Addenda: 0

## 2019-11-05 NOTE — Discharge Instructions (Signed)
Colonoscopy Discharge Instructions  Read the instructions outlined below and refer to this sheet in the next few weeks. These discharge instructions provide you with general information on caring for yourself after you leave the hospital. Your doctor may also give you specific instructions. While your treatment has been planned according to the most current medical practices available, unavoidable complications occasionally occur. If you have any problems or questions after discharge, call Dr. Gala Romney at [phone]. ACTIVITY  You may resume your regular activity, but move at a slower pace for the next 24 hours.   Take frequent rest periods for the next 24 hours.   Walking will help get rid of the air and reduce the bloated feeling in your belly (abdomen).   No driving for 24 hours (because of the medicine (anesthesia) used during the test).    Do not sign any important legal documents or operate any machinery for 24 hours (because of the anesthesia used during the test).  NUTRITION  Drink plenty of fluids.   You may resume your normal diet as instructed by your doctor.   Begin with a light meal and progress to your normal diet. Heavy or fried foods are harder to digest and may make you feel sick to your stomach (nauseated).   Avoid alcoholic beverages for 24 hours or as instructed.  MEDICATIONS  You may resume your normal medications unless your doctor tells you otherwise.  WHAT YOU CAN EXPECT TODAY  Some feelings of bloating in the abdomen.   Passage of more gas than usual.   Spotting of blood in your stool or on the toilet paper.  IF YOU HAD POLYPS REMOVED DURING THE COLONOSCOPY:  No aspirin products for 7 days or as instructed.   No alcohol for 7 days or as instructed.   Eat a soft diet for the next 24 hours.  FINDING OUT THE RESULTS OF YOUR TEST Not all test results are available during your visit. If your test results are not back during the visit, make an appointment  with your caregiver to find out the results. Do not assume everything is normal if you have not heard from your caregiver or the medical facility. It is important for you to follow up on all of your test results.  SEEK IMMEDIATE MEDICAL ATTENTION IF:  You have more than a spotting of blood in your stool.   Your belly is swollen (abdominal distention).   You are nauseated or vomiting.   You have a temperature over 101.   You have abdominal pain or discomfort that is severe or gets worse throughout the day.    Colon polyp information provided -large polyp removed previously completely gone.  2 smaller new polyps removed  Diverticulosis information provided  Further recommendations to follow pending review of pathology report  At patient request, I called Neoma Laming at [phone] -party only other and said it was a wrong number    Diverticulosis  Diverticulosis is a condition that develops when small pouches (diverticula) form in the wall of the large intestine (colon). The colon is where water is absorbed and stool (feces) is formed. The pouches form when the inside layer of the colon pushes through weak spots in the outer layers of the colon. You may have a few pouches or many of them. The pouches usually do not cause problems unless they become inflamed or infected. When this happens, the condition is called diverticulitis. What are the causes? The cause of this condition is not known. What  increases the risk? The following factors may make you more likely to develop this condition:  Being older than age 6. Your risk for this condition increases with age. Diverticulosis is rare among people younger than age 51. By age 56, many people have it.  Eating a low-fiber diet.  Having frequent constipation.  Being overweight.  Not getting enough exercise.  Smoking.  Taking over-the-counter pain medicines, like aspirin and ibuprofen.  Having a family history of diverticulosis. What  are the signs or symptoms? In most people, there are no symptoms of this condition. If you do have symptoms, they may include:  Bloating.  Cramps in the abdomen.  Constipation or diarrhea.  Pain in the lower left side of the abdomen. How is this diagnosed? Because diverticulosis usually has no symptoms, it is most often diagnosed during an exam for other colon problems. The condition may be diagnosed by:  Using a flexible scope to examine the colon (colonoscopy).  Taking an X-ray of the colon after dye has been put into the colon (barium enema).  Having a CT scan. How is this treated? You may not need treatment for this condition. Your health care provider may recommend treatment to prevent problems. You may need treatment if you have symptoms or if you previously had diverticulitis. Treatment may include:  Eating a high-fiber diet.  Taking a fiber supplement.  Taking a live bacteria supplement (probiotic).  Taking medicine to relax your colon. Follow these instructions at home: Medicines  Take over-the-counter and prescription medicines only as told by your health care provider.  If told by your health care provider, take a fiber supplement or probiotic. Constipation prevention Your condition may cause constipation. To prevent or treat constipation, you may need to:  Drink enough fluid to keep your urine pale yellow.  Take over-the-counter or prescription medicines.  Eat foods that are high in fiber, such as beans, whole grains, and fresh fruits and vegetables.  Limit foods that are high in fat and processed sugars, such as fried or sweet foods.  General instructions  Try not to strain when you have a bowel movement.  Keep all follow-up visits as told by your health care provider. This is important. Contact a health care provider if you:  Have pain in your abdomen.  Have bloating.  Have cramps.  Have not had a bowel movement in 3 days. Get help right away  if:  Your pain gets worse.  Your bloating becomes very bad.  You have a fever or chills, and your symptoms suddenly get worse.  You vomit.  You have bowel movements that are bloody or black.  You have bleeding from your rectum. Summary  Diverticulosis is a condition that develops when small pouches (diverticula) form in the wall of the large intestine (colon).  You may have a few pouches or many of them.  This condition is most often diagnosed during an exam for other colon problems.  Treatment may include increasing the fiber in your diet, taking supplements, or taking medicines. This information is not intended to replace advice given to you by your health care provider. Make sure you discuss any questions you have with your health care provider. Document Revised: 01/07/2019 Document Reviewed: 01/07/2019 Elsevier Patient Education  [place].  Colon Polyps  Polyps are tissue growths inside the body. Polyps can grow in many places, including the large intestine (colon). A polyp may be a round bump or a mushroom-shaped growth. You could have one polyp or  several. Most colon polyps are noncancerous (benign). However, some colon polyps can become cancerous over time. Finding and removing the polyps early can help prevent this. What are the causes? The exact cause of colon polyps is not known. What increases the risk? You are more likely to develop this condition if you:  Have a family history of colon cancer or colon polyps.  Are older than 56 or older than 45 if you are African American.  Have inflammatory bowel disease, such as ulcerative colitis or Crohn's disease.  Have certain hereditary conditions, such as: ? Familial adenomatous polyposis. ? Lynch syndrome. ? Turcot syndrome. ? Peutz-Jeghers syndrome.  Are overweight.  Smoke cigarettes.  Do not get enough exercise.  Drink too much alcohol.  Eat a diet that is high in fat and red meat and low in  fiber.  Had childhood cancer that was treated with abdominal radiation. What are the signs or symptoms? Most polyps do not cause symptoms. If you have symptoms, they may include:  Blood coming from your rectum when having a bowel movement.  Blood in your stool. The stool may look dark red or black.  Abdominal pain.  A change in bowel habits, such as constipation or diarrhea. How is this diagnosed? This condition is diagnosed with a colonoscopy. This is a procedure in which a lighted, flexible scope is inserted into the anus and then passed into the colon to examine the area. Polyps are sometimes found when a colonoscopy is done as part of routine cancer screening tests. How is this treated? Treatment for this condition involves removing any polyps that are found. Most polyps can be removed during a colonoscopy. Those polyps will then be tested for cancer. Additional treatment may be needed depending on the results of testing. Follow these instructions at home: Lifestyle  Maintain a healthy weight, or lose weight if recommended by your health care provider.  Exercise every day or as told by your health care provider.  Do not use any products that contain nicotine or tobacco, such as cigarettes and e-cigarettes. If you need help quitting, ask your health care provider.  If you drink alcohol, limit how much you have: ? 0-1 drink a day for women. ? 0-2 drinks a day for men.  Be aware of how much alcohol is in your drink. In the [place] one drink equals one 12 oz bottle of beer (355 mL), one 5 oz glass of wine (148 mL), or one 1 oz shot of hard liquor (44 mL). Eating and drinking   Eat foods that are high in fiber, such as fruits, vegetables, and whole grains.  Eat foods that are high in calcium and vitamin D, such as milk, cheese, yogurt, eggs, liver, fish, and broccoli.  Limit foods that are high in fat, such as fried foods and desserts.  Limit the amount of red meat and  processed meat you eat, such as hot dogs, sausage, bacon, and lunch meats. General instructions  Keep all follow-up visits as told by your health care provider. This is important. ? This includes having regularly scheduled colonoscopies. ? Talk to your health care provider about when you need a colonoscopy. Contact a health care provider if:  You have new or worsening bleeding during a bowel movement.  You have new or increased blood in your stool.  You have a change in bowel habits.  You lose weight for no known reason. Summary  Polyps are tissue growths inside the body. Polyps can grow in  many places, including the colon.  Most colon polyps are noncancerous (benign), but some can become cancerous over time.  This condition is diagnosed with a colonoscopy.  Treatment for this condition involves removing any polyps that are found. Most polyps can be removed during a colonoscopy. This information is not intended to replace advice given to you by your health care provider. Make sure you discuss any questions you have with your health care provider. Document Revised: 09/25/2017 Document Reviewed: 09/25/2017 Elsevier Patient Education  [place].

## 2019-11-08 ENCOUNTER — Encounter: Payer: Self-pay | Admitting: Internal Medicine

## 2019-11-08 LAB — SURGICAL PATHOLOGY

## 2019-11-09 ENCOUNTER — Telehealth: Payer: Self-pay

## 2019-11-09 ENCOUNTER — Encounter: Payer: Self-pay | Admitting: Internal Medicine

## 2019-11-09 NOTE — Telephone Encounter (Signed)
Per RMR- Send letter to patient.  Send copy of letter with path to referring provider and PCP.   Needs fs in 3 months w tcs prep to recheck rectum

## 2019-11-09 NOTE — Telephone Encounter (Signed)
Susan, please schedule ov 

## 2019-11-09 NOTE — Telephone Encounter (Signed)
OV made °

## 2020-01-27 ENCOUNTER — Ambulatory Visit (INDEPENDENT_AMBULATORY_CARE_PROVIDER_SITE_OTHER): Payer: Medicare Other | Admitting: Nurse Practitioner

## 2020-01-27 ENCOUNTER — Other Ambulatory Visit: Payer: Self-pay

## 2020-01-27 ENCOUNTER — Encounter: Payer: Self-pay | Admitting: Nurse Practitioner

## 2020-01-27 ENCOUNTER — Ambulatory Visit: Payer: Medicare Other | Admitting: Nurse Practitioner

## 2020-01-27 VITALS — BP 128/79 | HR 79 | Temp 98.4°F | Ht 67.0 in | Wt 224.6 lb

## 2020-01-27 DIAGNOSIS — Z86010 Personal history of colonic polyps: Secondary | ICD-10-CM

## 2020-01-27 DIAGNOSIS — K219 Gastro-esophageal reflux disease without esophagitis: Secondary | ICD-10-CM | POA: Insufficient documentation

## 2020-01-27 NOTE — Progress Notes (Signed)
Cc'ed to pcp °

## 2020-01-27 NOTE — Patient Instructions (Signed)
Your health issues we discussed today were:   GERD (reflux/heartburn): 1. I am glad your medication is working for you! 2. Continue to take Protonix as you have been 3. Call us for any worsening or severe symptoms  Large polyps and 2 colonoscopies thus far this year: 1. Per your request, we will bring you back in 6 months to discuss the timing of another colonoscopy next year to make sure all of the polyp has been removed 2. In the meantime let us know if you have any worrisome symptoms such as significant rectal bleeding, unintentional weight loss, worsening weakness/fatigue  Overall I recommend:  1. Continue your other current medications 2. Return for follow-up in 6 months 3. Call us if you have any questions or concerns   ---------------------------------------------------------------  I am glad you have gotten your COVID-19 vaccination!  Even though you are fully vaccinated you should continue to follow CDC and state/local guidelines.  ---------------------------------------------------------------   At [facility] we value your feedback. You may receive a survey about your visit today. Please share your experience as we strive to create trusting relationships with our patients to provide genuine, compassionate, quality care.  We appreciate your understanding and patience as we review any laboratory studies, imaging, and other diagnostic tests that are ordered as we care for you. Our office policy is 5 business days for review of these results, and any emergent or urgent results are addressed in a timely manner for your best interest. If you do not hear from our office in 1 week, please contact us.   We also encourage the use of MyChart, which contains your medical information for your review as well. If you are not enrolled in this feature, an access code is on this after visit summary for your convenience. Thank you for allowing [place] to be involved in your care.  It  was great to see you today!  I hope you have a great Summer!!

## 2020-01-27 NOTE — Assessment & Plan Note (Signed)
Chronic GERD, currently doing well on Protonix daily.  Recommend he continue his current medications and follow-up in 6 months.  Call for any worsening symptoms before then.

## 2020-01-27 NOTE — Assessment & Plan Note (Signed)
History of adenomatous colon polyps and specifically large polyps as per HPI.  He has had 2 colonoscopies this year thus far.  Recommended for 31-month repeat due to 10 mm tubular adenoma polyp at his last colonoscopy in May which had high-grade dysplasia.  Because of the number of procedures she has had this year he is wanting to hold off.  He states he is okay with about once a year.  He is agreeable to come back in 6 months at the start of the new year and discuss a repeat exam.  He is requesting SuPrep at his next colonoscopy as this was much more tolerable than TriLyte.  Call for any worsening symptoms, otherwise follow-up in 6 months.
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
## 2020-02-03 ENCOUNTER — Other Ambulatory Visit: Payer: Medicare Other

## 2020-02-03 ENCOUNTER — Other Ambulatory Visit: Payer: Self-pay

## 2020-02-03 DIAGNOSIS — E785 Hyperlipidemia, unspecified: Secondary | ICD-10-CM

## 2020-02-03 DIAGNOSIS — E7800 Pure hypercholesterolemia, unspecified: Secondary | ICD-10-CM

## 2020-02-03 DIAGNOSIS — Z125 Encounter for screening for malignant neoplasm of prostate: Secondary | ICD-10-CM

## 2020-02-03 DIAGNOSIS — I10 Essential (primary) hypertension: Secondary | ICD-10-CM | POA: Diagnosis not present

## 2020-02-03 LAB — CBC WITH DIFFERENTIAL/PLATELET
Absolute Monocytes: 510 cells/uL (ref 200–950)
Basophils Absolute: 48 cells/uL (ref 0–200)
Basophils Relative: 0.7 %
Eosinophils Absolute: 68 cells/uL (ref 15–500)
Eosinophils Relative: 1 %
HCT: 42.2 % (ref 38.5–50.0)
Hemoglobin: 14.3 g/dL (ref 13.2–17.1)
Lymphs Abs: 1965 cells/uL (ref 850–3900)
MCH: 30.4 pg (ref 27.0–33.0)
MCHC: 33.9 g/dL (ref 32.0–36.0)
MCV: 89.8 fL (ref 80.0–100.0)
MPV: 10.9 fL (ref 7.5–12.5)
Monocytes Relative: 7.5 %
Neutro Abs: 4209 cells/uL (ref 1500–7800)
Neutrophils Relative %: 61.9 %
Platelets: 214 10*3/uL (ref 140–400)
RBC: 4.7 10*6/uL (ref 4.20–5.80)
RDW: 12.5 % (ref 11.0–15.0)
Total Lymphocyte: 28.9 %
WBC: 6.8 10*3/uL (ref 3.8–10.8)

## 2020-02-03 LAB — COMPLETE METABOLIC PANEL WITH GFR
AG Ratio: 2 (calc) (ref 1.0–2.5)
ALT: 35 U/L (ref 9–46)
AST: 21 U/L (ref 10–35)
Albumin: 4.5 g/dL (ref 3.6–5.1)
Alkaline phosphatase (APISO): 70 U/L (ref 35–144)
BUN/Creatinine Ratio: 20 (calc) (ref 6–22)
BUN: 25 mg/dL (ref 7–25)
CO2: 33 mmol/L — ABNORMAL HIGH (ref 20–32)
Calcium: 9.9 mg/dL (ref 8.6–10.3)
Chloride: 101 mmol/L (ref 98–110)
Creat: 1.28 mg/dL — ABNORMAL HIGH (ref 0.70–1.18)
GFR, Est African American: 64 mL/min/{1.73_m2} (ref >=60)
GFR, Est Non African American: 56 mL/min/{1.73_m2} — ABNORMAL LOW (ref >=60)
Globulin: 2.2 g/dL (calc) (ref 1.9–3.7)
Glucose, Bld: 155 mg/dL — ABNORMAL HIGH (ref 65–99)
Potassium: 4.2 mmol/L (ref 3.5–5.3)
Sodium: 140 mmol/L (ref 135–146)
Total Bilirubin: 0.5 mg/dL (ref 0.2–1.2)
Total Protein: 6.7 g/dL (ref 6.1–8.1)

## 2020-02-03 LAB — PSA: PSA: 3.2 ng/mL (ref <=4.0)

## 2020-02-03 LAB — LIPID PANEL
Cholesterol: 144 mg/dL (ref <200)
HDL: 57 mg/dL (ref >=40)
LDL Cholesterol (Calc): 71 mg/dL (calc)
Non-HDL Cholesterol (Calc): 87 mg/dL (calc) (ref <130)
Total CHOL/HDL Ratio: 2.5 (calc) (ref <5.0)
Triglycerides: 81 mg/dL (ref <150)

## 2020-02-04 ENCOUNTER — Other Ambulatory Visit: Payer: Self-pay

## 2020-02-07 ENCOUNTER — Other Ambulatory Visit: Payer: Self-pay | Admitting: Family Medicine

## 2020-02-10 ENCOUNTER — Other Ambulatory Visit: Payer: Self-pay

## 2020-02-10 ENCOUNTER — Ambulatory Visit (INDEPENDENT_AMBULATORY_CARE_PROVIDER_SITE_OTHER): Payer: Medicare Other | Admitting: Family Medicine

## 2020-02-10 ENCOUNTER — Encounter: Payer: Self-pay | Admitting: Family Medicine

## 2020-02-10 VITALS — BP 102/60 | HR 79 | Temp 98.1°F | Ht 67.0 in | Wt 224.0 lb

## 2020-02-10 DIAGNOSIS — I10 Essential (primary) hypertension: Secondary | ICD-10-CM | POA: Diagnosis not present

## 2020-02-10 DIAGNOSIS — E7800 Pure hypercholesterolemia, unspecified: Secondary | ICD-10-CM | POA: Diagnosis not present

## 2020-02-10 DIAGNOSIS — Z0001 Encounter for general adult medical examination with abnormal findings: Secondary | ICD-10-CM | POA: Diagnosis not present

## 2020-02-10 DIAGNOSIS — R739 Hyperglycemia, unspecified: Secondary | ICD-10-CM

## 2020-02-10 DIAGNOSIS — R5382 Chronic fatigue, unspecified: Secondary | ICD-10-CM | POA: Diagnosis not present

## 2020-02-10 DIAGNOSIS — Z86010 Personal history of colonic polyps: Secondary | ICD-10-CM

## 2020-02-10 DIAGNOSIS — Z0000 Encounter for general adult medical examination without abnormal findings: Secondary | ICD-10-CM

## 2020-02-10 MED ORDER — ROSUVASTATIN CALCIUM 20 MG PO TABS: 20.0000 mg | ORAL_TABLET | Freq: Every day | ORAL | 3 refills | Status: DC

## 2020-02-10 MED ORDER — DOXAZOSIN MESYLATE 4 MG PO TABS: 4.0000 mg | ORAL_TABLET | Freq: Every day | ORAL | 3 refills | Status: DC

## 2020-02-10 MED ORDER — LOSARTAN POTASSIUM 50 MG PO TABS: 50.0000 mg | ORAL_TABLET | Freq: Every day | ORAL | 3 refills | Status: DC

## 2020-02-10 MED ORDER — FINASTERIDE 5 MG PO TABS
5.0000 mg | ORAL_TABLET | Freq: Every day | ORAL | 1 refills | Status: DC
Start: 2020-02-10 — End: 2021-03-13

## 2020-02-10 MED ORDER — HYDROCHLOROTHIAZIDE 25 MG PO TABS: 25.0000 mg | ORAL_TABLET | Freq: Every day | ORAL | 3 refills | Status: DC

## 2020-02-10 MED ORDER — AMLODIPINE BESYLATE 10 MG PO TABS: 10.0000 mg | ORAL_TABLET | Freq: Every day | ORAL | 3 refills | Status: DC
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
## 2020-02-11 LAB — HEMOGLOBIN A1C
Hgb A1c MFr Bld: 5.9 % of total Hgb — ABNORMAL HIGH (ref <5.7)
Mean Plasma Glucose: 123 (calc)
eAG (mmol/L): 6.8 (calc)

## 2020-02-11 LAB — TESTOSTERONE TOTAL,FREE,BIO, MALES
Albumin: 4.4 g/dL (ref 3.6–5.1)
Sex Hormone Binding: 32 nmol/L (ref 22–77)
Testosterone, Bioavailable: 113.1 ng/dL (ref 15.0–150.0)
Testosterone, Free: 56.2 pg/mL (ref 6.0–73.0)
Testosterone: 414 ng/dL (ref 250–827)

## 2020-02-11 LAB — TSH: TSH: 2.69 mIU/L (ref 0.40–4.50)

## 2020-02-14 ENCOUNTER — Encounter: Payer: Self-pay | Admitting: *Deleted

## 2020-05-27 ENCOUNTER — Other Ambulatory Visit: Payer: Self-pay | Admitting: Family Medicine

## 2020-06-05 ENCOUNTER — Other Ambulatory Visit: Payer: Self-pay | Admitting: Family Medicine

## 2020-07-27 ENCOUNTER — Ambulatory Visit: Payer: Medicare Other | Admitting: Nurse Practitioner

## 2020-07-27 ENCOUNTER — Encounter: Payer: Self-pay | Admitting: Nurse Practitioner

## 2020-07-27 ENCOUNTER — Ambulatory Visit (INDEPENDENT_AMBULATORY_CARE_PROVIDER_SITE_OTHER): Payer: Medicare Other | Admitting: Nurse Practitioner

## 2020-07-27 ENCOUNTER — Other Ambulatory Visit: Payer: Self-pay

## 2020-07-27 VITALS — BP 129/77 | HR 82 | Temp 97.0°F | Ht 67.0 in | Wt 225.8 lb

## 2020-07-27 DIAGNOSIS — K219 Gastro-esophageal reflux disease without esophagitis: Secondary | ICD-10-CM

## 2020-07-27 DIAGNOSIS — Z86010 Personal history of colonic polyps: Secondary | ICD-10-CM
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
## 2020-07-27 NOTE — Patient Instructions (Signed)
Your health issues we discussed today were:   GERD (reflux/heartburn): 1. I am glad you are doing well on your medication [place] 2. Continue to take your Protonix 40 mg once a day 3. Call us for any worsening or severe symptoms  History of large polyps: 1. As we discussed, there is a recommendation to repeat your colonoscopy in 6 months. 2. I understand you are not interested in pursuing this at this time but are okay to discuss further in the fall 3. Call us if you have any concerning symptoms such as large amount of bleeding, change in stools, worsening abdominal symptoms  Overall I recommend:  1. Continue other current medications 2. Return for follow-up in 6 months 3. Call us for any questions or concerns   ---------------------------------------------------------------  I am glad you have gotten your COVID-19 vaccination!  Even though you are fully vaccinated you should continue to follow CDC and state/local guidelines.  ---------------------------------------------------------------   At [facility] we value your feedback. You may receive a survey about your visit today. Please share your experience as we strive to create trusting relationships with our patients to provide genuine, compassionate, quality care.  We appreciate your understanding and patience as we review any laboratory studies, imaging, and other diagnostic tests that are ordered as we care for you. Our office policy is 5 business days for review of these results, and any emergent or urgent results are addressed in a timely manner for your best interest. If you do not hear from our office in 1 week, please contact us.   We also encourage the use of MyChart, which contains your medical information for your review as well. If you are not enrolled in this feature, an access code is on this after visit summary for your convenience. Thank you for allowing [place] to be involved in your care.  It was  great to see you today!  I hope you have a safe and warm winter and good luck with your tooth extraction!!

## 2020-08-03 ENCOUNTER — Other Ambulatory Visit: Payer: Self-pay | Admitting: *Deleted

## 2020-08-03 MED ORDER — LOSARTAN POTASSIUM 50 MG PO TABS: 50.0000 mg | ORAL_TABLET | Freq: Every day | ORAL | 3 refills | Status: DC

## 2020-08-03 MED ORDER — DOXAZOSIN MESYLATE 4 MG PO TABS: 4.0000 mg | ORAL_TABLET | Freq: Every day | ORAL | 3 refills | Status: DC

## 2020-08-03 MED ORDER — HYDROCHLOROTHIAZIDE 25 MG PO TABS: 25.0000 mg | ORAL_TABLET | Freq: Every day | ORAL | 3 refills | Status: DC

## 2020-08-10 ENCOUNTER — Other Ambulatory Visit: Payer: Self-pay | Admitting: *Deleted

## 2020-08-10 NOTE — Telephone Encounter (Signed)
Received VM from patient.   Requested to have refills sent to [facility] in [place], but did not specify which [facility] he preferred.   Requested medications are as follows: Doxazosin Losartan HCTZ  Call placed to patient. [place].

## 2020-08-11 NOTE — Telephone Encounter (Signed)
Received VM from patient again requesting refill on medications to be sent to [facility] in [place].  Did not specify which [facility] he is requesting.   Call placed to patient. Advised to please specify which pharmacy he is requesting on VM.

## 2020-08-14 ENCOUNTER — Other Ambulatory Visit: Payer: Self-pay | Admitting: *Deleted

## 2020-08-14 MED ORDER — DOXAZOSIN MESYLATE 4 MG PO TABS: 4.0000 mg | ORAL_TABLET | Freq: Every day | ORAL | 3 refills | Status: DC

## 2020-08-14 MED ORDER — LOSARTAN POTASSIUM 50 MG PO TABS: 50.0000 mg | ORAL_TABLET | Freq: Every day | ORAL | 3 refills | Status: DC

## 2020-08-14 MED ORDER — HYDROCHLOROTHIAZIDE 25 MG PO TABS: 25.0000 mg | ORAL_TABLET | Freq: Every day | ORAL | 3 refills | Status: DC

## 2020-08-14 NOTE — Addendum Note (Signed)
Addended by: Sheral Flow on: 08/14/2020 08:00 AM   Modules accepted: Orders

## 2020-08-14 NOTE — Telephone Encounter (Signed)
Received call from patient.   Again asked for prescriptions to be sent to [facility] in [place], but again failed to specify which [facility].  Prescription sent to [facility] [address].

## 2020-09-04 ENCOUNTER — Other Ambulatory Visit: Payer: Self-pay | Admitting: *Deleted

## 2020-09-04 MED ORDER — PANTOPRAZOLE SODIUM 40 MG PO TBEC: 40.0000 mg | DELAYED_RELEASE_TABLET | Freq: Every day | ORAL | 1 refills | Status: DC

## 2020-09-04 MED ORDER — ROSUVASTATIN CALCIUM 20 MG PO TABS: 20.0000 mg | ORAL_TABLET | Freq: Every day | ORAL | 1 refills | Status: DC

## 2020-10-02 ENCOUNTER — Telehealth: Payer: Self-pay | Admitting: Family Medicine

## 2020-10-02 NOTE — Progress Notes (Signed)
  Chronic Care Management   Outreach Note  10/02/2020 Name: Dakwan Pridgen MRN: [id number] DOB: 1945-09-25  Referred by: Susy Frizzle, MD Reason for referral : No chief complaint on file.   An unsuccessful telephone outreach was attempted today. The patient was referred to the pharmacist for assistance with care management and care coordination.   Follow Up Plan:   Carley Perdue [employer] Scheduler

## 2020-10-16 ENCOUNTER — Telehealth: Payer: Self-pay | Admitting: Family Medicine

## 2020-10-16 NOTE — Progress Notes (Signed)
  Chronic Care Management   Outreach Note  10/16/2020 Name: Joseph Dillon MRN: [id number] DOB: 03/20/1946  Referred by: Susy Frizzle, MD Reason for referral : No chief complaint on file.   A second unsuccessful telephone outreach was attempted today. The patient was referred to pharmacist for assistance with care management and care coordination.  Follow Up Plan:   Carley Perdue [employer] Scheduler

## 2020-11-01 ENCOUNTER — Telehealth: Payer: Self-pay | Admitting: Family Medicine

## 2020-11-01 NOTE — Progress Notes (Signed)
  Chronic Care Management   Outreach Note  11/01/2020 Name: Joseph Dillon MRN: [id number] DOB: 23-May-1946  Referred by: Susy Frizzle, MD Reason for referral : No chief complaint on file.   Third unsuccessful telephone outreach was attempted today. The patient was referred to the pharmacist for assistance with care management and care coordination.   Follow Up Plan:   Carley Perdue [employer] Scheduler

## 2020-11-23 ENCOUNTER — Other Ambulatory Visit: Payer: Self-pay

## 2020-11-23 MED ORDER — AMLODIPINE BESYLATE 10 MG PO TABS: 10.0000 mg | ORAL_TABLET | Freq: Every day | ORAL | 3 refills | Status: DC

## 2021-02-07 ENCOUNTER — Encounter: Payer: Self-pay | Admitting: Gastroenterology
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
## 2021-02-08 ENCOUNTER — Other Ambulatory Visit: Payer: Self-pay

## 2021-02-08 ENCOUNTER — Ambulatory Visit (INDEPENDENT_AMBULATORY_CARE_PROVIDER_SITE_OTHER): Payer: Medicare Other | Admitting: Gastroenterology

## 2021-02-08 ENCOUNTER — Encounter: Payer: Self-pay | Admitting: Gastroenterology

## 2021-02-08 ENCOUNTER — Ambulatory Visit: Payer: Medicare Other | Admitting: Nurse Practitioner

## 2021-02-08 VITALS — BP 130/78 | HR 89 | Temp 97.1°F | Ht 67.0 in | Wt 221.8 lb

## 2021-02-08 DIAGNOSIS — K219 Gastro-esophageal reflux disease without esophagitis: Secondary | ICD-10-CM

## 2021-02-08 DIAGNOSIS — Z86010 Personal history of colonic polyps: Secondary | ICD-10-CM | POA: Diagnosis not present

## 2021-02-08 NOTE — Patient Instructions (Signed)
We will arrange for you to have a colonoscopy in the near future with Dr. Gala Romney. As you have requested your procedure to be scheduled in November, we will give you a call once we have Dr. Gala Romney schedule for that timeframe.  Continue Protonix 40 mg daily 30 minutes before breakfast.  We will plan to follow-up with you in 1 year or sooner if Dr. Gala Romney recommends.  Do not hesitate to call if you have any new GI concerns.   It was a pleasure meeting you today!  Aliene Altes, PA-C [facility]

## 2021-02-21 ENCOUNTER — Telehealth: Payer: Self-pay

## 2021-02-21 ENCOUNTER — Other Ambulatory Visit: Payer: Self-pay

## 2021-02-21 MED ORDER — NA SULFATE-K SULFATE-MG SULF 17.5-3.13-1.6 GM/177ML PO SOLN: 1.0000 | ORAL | 0 refills | Status: DC

## 2021-02-21 NOTE — Telephone Encounter (Signed)
Pt called office, he decided to do TCS w/Dr. Gala Romney in September instead of November. TCS scheduled for 03/21/21 at 10:30am. He requested same prep he had in 2020. Rx for Suprep sent to pharmacy. Instructions mailed. Orders entered.  PA for TCS submitted via [facility] website. PA# [id number], valid 03/21/21-06/19/21.

## 2021-03-03 ENCOUNTER — Other Ambulatory Visit: Payer: Self-pay | Admitting: Family Medicine

## 2021-03-06 ENCOUNTER — Encounter: Payer: Medicare Other | Admitting: Family Medicine

## 2021-03-07 ENCOUNTER — Other Ambulatory Visit: Payer: Self-pay

## 2021-03-07 ENCOUNTER — Other Ambulatory Visit: Payer: Medicare Other

## 2021-03-07 DIAGNOSIS — Z125 Encounter for screening for malignant neoplasm of prostate: Secondary | ICD-10-CM

## 2021-03-07 DIAGNOSIS — E7800 Pure hypercholesterolemia, unspecified: Secondary | ICD-10-CM | POA: Diagnosis not present

## 2021-03-07 DIAGNOSIS — Z13220 Encounter for screening for lipoid disorders: Secondary | ICD-10-CM | POA: Diagnosis not present

## 2021-03-07 DIAGNOSIS — Z136 Encounter for screening for cardiovascular disorders: Secondary | ICD-10-CM | POA: Diagnosis not present

## 2021-03-07 DIAGNOSIS — R739 Hyperglycemia, unspecified: Secondary | ICD-10-CM | POA: Diagnosis not present

## 2021-03-08 LAB — COMPLETE METABOLIC PANEL WITH GFR
AG Ratio: 2.5 (calc) (ref 1.0–2.5)
ALT: 40 U/L (ref 9–46)
AST: 23 U/L (ref 10–35)
Albumin: 4.8 g/dL (ref 3.6–5.1)
Alkaline phosphatase (APISO): 65 U/L (ref 35–144)
BUN: 20 mg/dL (ref 7–25)
CO2: 32 mmol/L (ref 20–32)
Calcium: 10.1 mg/dL (ref 8.6–10.3)
Chloride: 99 mmol/L (ref 98–110)
Creat: 1.21 mg/dL (ref 0.70–1.28)
Globulin: 1.9 g/dL (calc) (ref 1.9–3.7)
Glucose, Bld: 177 mg/dL — ABNORMAL HIGH (ref 65–99)
Potassium: 4.6 mmol/L (ref 3.5–5.3)
Sodium: 139 mmol/L (ref 135–146)
Total Bilirubin: 0.5 mg/dL (ref 0.2–1.2)
Total Protein: 6.7 g/dL (ref 6.1–8.1)
eGFR: 63 mL/min/{1.73_m2} (ref >=60)

## 2021-03-08 LAB — CBC WITH DIFFERENTIAL/PLATELET
Absolute Monocytes: 576 cells/uL (ref 200–950)
Basophils Absolute: 29 cells/uL (ref 0–200)
Basophils Relative: 0.4 %
Eosinophils Absolute: 72 cells/uL (ref 15–500)
Eosinophils Relative: 1 %
HCT: 44.1 % (ref 38.5–50.0)
Hemoglobin: 15 g/dL (ref 13.2–17.1)
Lymphs Abs: 2002 cells/uL (ref 850–3900)
MCH: 30.1 pg (ref 27.0–33.0)
MCHC: 34 g/dL (ref 32.0–36.0)
MCV: 88.6 fL (ref 80.0–100.0)
MPV: 11.1 fL (ref 7.5–12.5)
Monocytes Relative: 8 %
Neutro Abs: 4522 cells/uL (ref 1500–7800)
Neutrophils Relative %: 62.8 %
Platelets: 239 10*3/uL (ref 140–400)
RBC: 4.98 10*6/uL (ref 4.20–5.80)
RDW: 12.5 % (ref 11.0–15.0)
Total Lymphocyte: 27.8 %
WBC: 7.2 10*3/uL (ref 3.8–10.8)

## 2021-03-08 LAB — LIPID PANEL
Cholesterol: 143 mg/dL (ref <200)
HDL: 61 mg/dL (ref >=40)
LDL Cholesterol (Calc): 63 mg/dL (calc)
Non-HDL Cholesterol (Calc): 82 mg/dL (calc) (ref <130)
Total CHOL/HDL Ratio: 2.3 (calc) (ref <5.0)
Triglycerides: 100 mg/dL (ref <150)

## 2021-03-08 LAB — HEMOGLOBIN A1C
Hgb A1c MFr Bld: 6 % of total Hgb — ABNORMAL HIGH (ref <5.7)
Mean Plasma Glucose: 126 mg/dL
eAG (mmol/L): 7 mmol/L

## 2021-03-08 LAB — PSA: PSA: 3.93 ng/mL (ref <=4.00)

## 2021-03-13 ENCOUNTER — Other Ambulatory Visit: Payer: Self-pay

## 2021-03-13 ENCOUNTER — Ambulatory Visit (INDEPENDENT_AMBULATORY_CARE_PROVIDER_SITE_OTHER): Payer: Medicare Other | Admitting: Family Medicine

## 2021-03-13 ENCOUNTER — Encounter: Payer: Self-pay | Admitting: Family Medicine

## 2021-03-13 VITALS — BP 132/64 | HR 72 | Temp 98.9°F | Resp 16 | Ht 67.0 in | Wt 218.0 lb

## 2021-03-13 DIAGNOSIS — Z23 Encounter for immunization: Secondary | ICD-10-CM

## 2021-03-13 DIAGNOSIS — Z86010 Personal history of colonic polyps: Secondary | ICD-10-CM | POA: Diagnosis not present

## 2021-03-13 DIAGNOSIS — I10 Essential (primary) hypertension: Secondary | ICD-10-CM | POA: Diagnosis not present

## 2021-03-13 DIAGNOSIS — Z0000 Encounter for general adult medical examination without abnormal findings: Secondary | ICD-10-CM

## 2021-03-13 DIAGNOSIS — R739 Hyperglycemia, unspecified: Secondary | ICD-10-CM
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
## 2021-03-21 ENCOUNTER — Ambulatory Visit (HOSPITAL_COMMUNITY)
Admission: RE | Admit: 2021-03-21 | Discharge: 2021-03-21 | Disposition: A | Discharge status: Home / Self Care | Payer: Medicare Other | Source: Ambulatory Visit | Attending: Internal Medicine | Admitting: Internal Medicine

## 2021-03-21 ENCOUNTER — Encounter (HOSPITAL_COMMUNITY): Payer: Self-pay | Admitting: Internal Medicine

## 2021-03-21 ENCOUNTER — Encounter (HOSPITAL_COMMUNITY)
Admission: RE | Disposition: A | Discharge status: Home / Self Care | Payer: Self-pay | Source: Ambulatory Visit | Attending: Internal Medicine

## 2021-03-21 ENCOUNTER — Other Ambulatory Visit: Payer: Self-pay

## 2021-03-21 DIAGNOSIS — Z7982 Long term (current) use of aspirin: Secondary | ICD-10-CM | POA: Diagnosis not present

## 2021-03-21 DIAGNOSIS — Z8673 Personal history of transient ischemic attack (TIA), and cerebral infarction without residual deficits: Secondary | ICD-10-CM | POA: Diagnosis not present

## 2021-03-21 DIAGNOSIS — Z1211 Encounter for screening for malignant neoplasm of colon: Secondary | ICD-10-CM | POA: Diagnosis not present

## 2021-03-21 DIAGNOSIS — K5730 Diverticulosis of large intestine without perforation or abscess without bleeding: Secondary | ICD-10-CM | POA: Diagnosis not present

## 2021-03-21 DIAGNOSIS — Z87891 Personal history of nicotine dependence: Secondary | ICD-10-CM | POA: Insufficient documentation

## 2021-03-21 DIAGNOSIS — Z888 Allergy status to other drugs, medicaments and biological substances status: Secondary | ICD-10-CM | POA: Insufficient documentation

## 2021-03-21 DIAGNOSIS — K642 Third degree hemorrhoids: Secondary | ICD-10-CM | POA: Insufficient documentation

## 2021-03-21 DIAGNOSIS — K219 Gastro-esophageal reflux disease without esophagitis: Secondary | ICD-10-CM | POA: Diagnosis not present

## 2021-03-21 DIAGNOSIS — Z79899 Other long term (current) drug therapy: Secondary | ICD-10-CM | POA: Diagnosis not present

## 2021-03-21 DIAGNOSIS — D124 Benign neoplasm of descending colon: Secondary | ICD-10-CM | POA: Diagnosis not present

## 2021-03-21 DIAGNOSIS — Z880 Allergy status to penicillin: Secondary | ICD-10-CM | POA: Insufficient documentation

## 2021-03-21 DIAGNOSIS — K635 Polyp of colon: Secondary | ICD-10-CM | POA: Insufficient documentation

## 2021-03-21 DIAGNOSIS — I10 Essential (primary) hypertension: Secondary | ICD-10-CM | POA: Insufficient documentation

## 2021-03-21 DIAGNOSIS — E785 Hyperlipidemia, unspecified: Secondary | ICD-10-CM | POA: Insufficient documentation

## 2021-03-21 HISTORY — PX: POLYPECTOMY: SHX149

## 2021-03-21 HISTORY — PX: COLONOSCOPY: SHX5424

## 2021-03-21 SURGERY — COLONOSCOPY
Anesthesia: Moderate Sedation

## 2021-03-21 MED ORDER — MIDAZOLAM HCL 5 MG/5ML IJ SOLN
INTRAMUSCULAR | Status: AC
  Filled 2021-03-21: qty 10

## 2021-03-21 MED ORDER — MEPERIDINE HCL 100 MG/ML IJ SOLN
INTRAMUSCULAR | Status: DC | PRN
  Administered 2021-03-21: 25 mg via INTRAVENOUS

## 2021-03-21 MED ORDER — MEPERIDINE HCL 50 MG/ML IJ SOLN
INTRAMUSCULAR | Status: AC
  Filled 2021-03-21: qty 1

## 2021-03-21 MED ORDER — SODIUM CHLORIDE 0.9 % IV SOLN: INTRAVENOUS | Status: DC

## 2021-03-21 MED ORDER — MIDAZOLAM HCL 5 MG/5ML IJ SOLN
INTRAMUSCULAR | Status: DC | PRN
  Administered 2021-03-21: 2 mg via INTRAVENOUS
  Administered 2021-03-21: 1 mg via INTRAVENOUS

## 2021-03-21 MED ORDER — ONDANSETRON HCL 4 MG/2ML IJ SOLN
INTRAMUSCULAR | Status: DC | PRN
  Administered 2021-03-21: 4 mg via INTRAVENOUS

## 2021-03-21 MED ORDER — ONDANSETRON HCL 4 MG/2ML IJ SOLN
INTRAMUSCULAR | Status: AC
  Filled 2021-03-21: qty 2
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
## 2021-03-21 NOTE — Discharge Instructions (Addendum)
  Colonoscopy Discharge Instructions  Read the instructions outlined below and refer to this sheet in the next few weeks. These discharge instructions provide you with general information on caring for yourself after you leave the hospital. Your doctor may also give you specific instructions. While your treatment has been planned according to the most current medical practices available, unavoidable complications occasionally occur. If you have any problems or questions after discharge, call Dr. Gala Romney at [phone]. ACTIVITY You may resume your regular activity, but move at a slower pace for the next 24 hours.  Take frequent rest periods for the next 24 hours.  Walking will help get rid of the air and reduce the bloated feeling in your belly (abdomen).  No driving for 24 hours (because of the medicine (anesthesia) used during the test).   Do not sign any important legal documents or operate any machinery for 24 hours (because of the anesthesia used during the test).  NUTRITION Drink plenty of fluids.  You may resume your normal diet as instructed by your doctor.  Begin with a light meal and progress to your normal diet. Heavy or fried foods are harder to digest and may make you feel sick to your stomach (nauseated).  Avoid alcoholic beverages for 24 hours or as instructed.  MEDICATIONS You may resume your normal medications unless your doctor tells you otherwise.  WHAT YOU CAN EXPECT TODAY Some feelings of bloating in the abdomen.  Passage of more gas than usual.  Spotting of blood in your stool or on the toilet paper.  IF YOU HAD POLYPS REMOVED DURING THE COLONOSCOPY: No aspirin products for 7 days or as instructed.  No alcohol for 7 days or as instructed.  Eat a soft diet for the next 24 hours.  FINDING OUT THE RESULTS OF YOUR TEST Not all test results are available during your visit. If your test results are not back during the visit, make an appointment with your caregiver to find out the  results. Do not assume everything is normal if you have not heard from your caregiver or the medical facility. It is important for you to follow up on all of your test results.  SEEK IMMEDIATE MEDICAL ATTENTION IF: You have more than a spotting of blood in your stool.  Your belly is swollen (abdominal distention).  You are nauseated or vomiting.  You have a temperature over 101.  You have abdominal pain or discomfort that is severe or gets worse throughout the day.    Diverticulosis and colon polyp information provided  Only 1 small polyp found today and removed  Further recommendations to follow pending review of pathology report  At patient request, I called Kenyetta Fife at [phone] -  call rolled to voicemail.  "Mailbox full"

## 2021-03-27 ENCOUNTER — Encounter (HOSPITAL_COMMUNITY): Payer: Self-pay | Admitting: Internal Medicine

## 2021-03-27 ENCOUNTER — Encounter: Payer: Self-pay | Admitting: Internal Medicine

## 2021-03-27 LAB — SURGICAL PATHOLOGY

## 2021-06-10 IMAGING — CR CHEST - 2 VIEW
2 series · 2 of 2 positions shown · non-contrast
Comparison: 09/28/2015

CLINICAL DATA: Chest pain

EXAM:
CHEST - 2 VIEW

[chest pa]
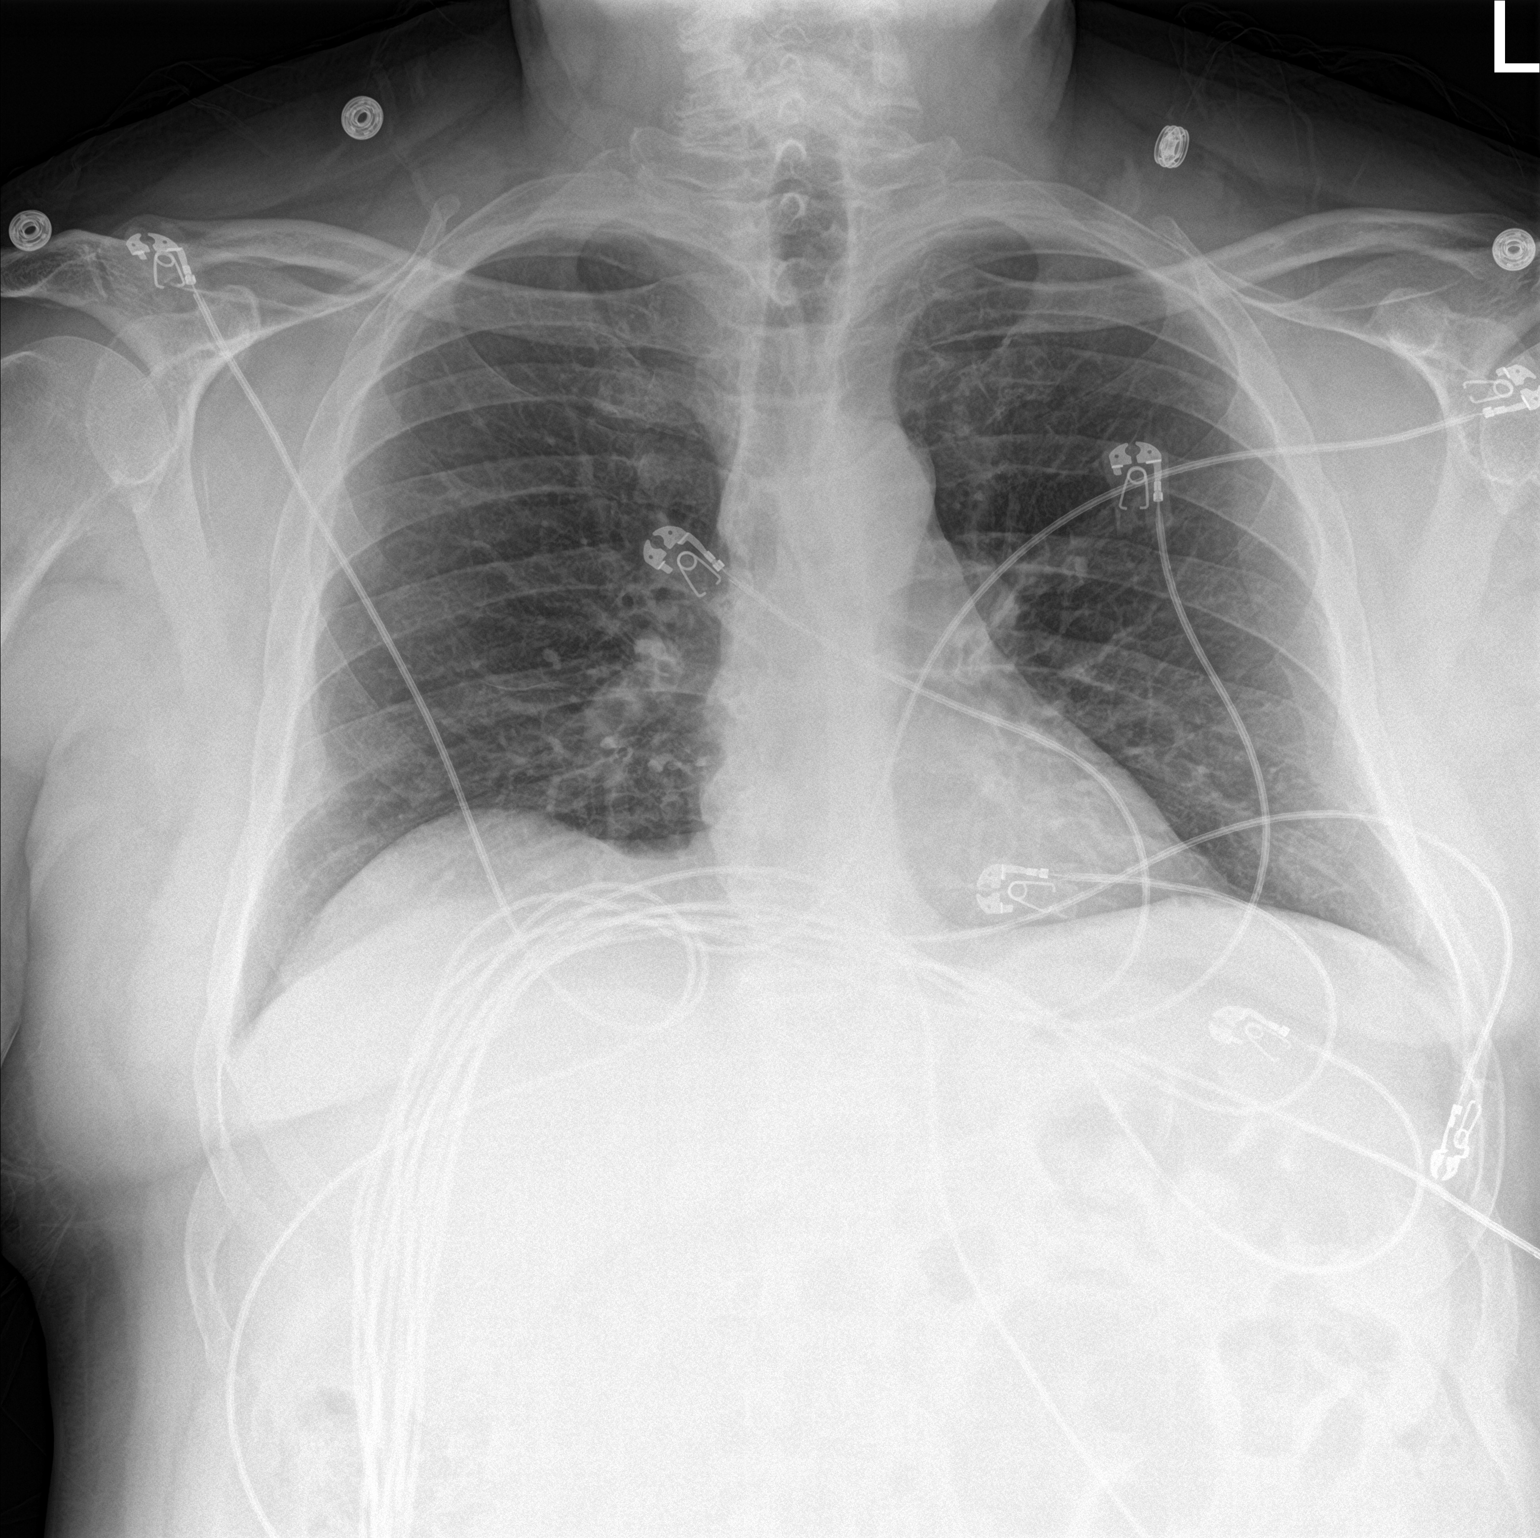

[chest lat]
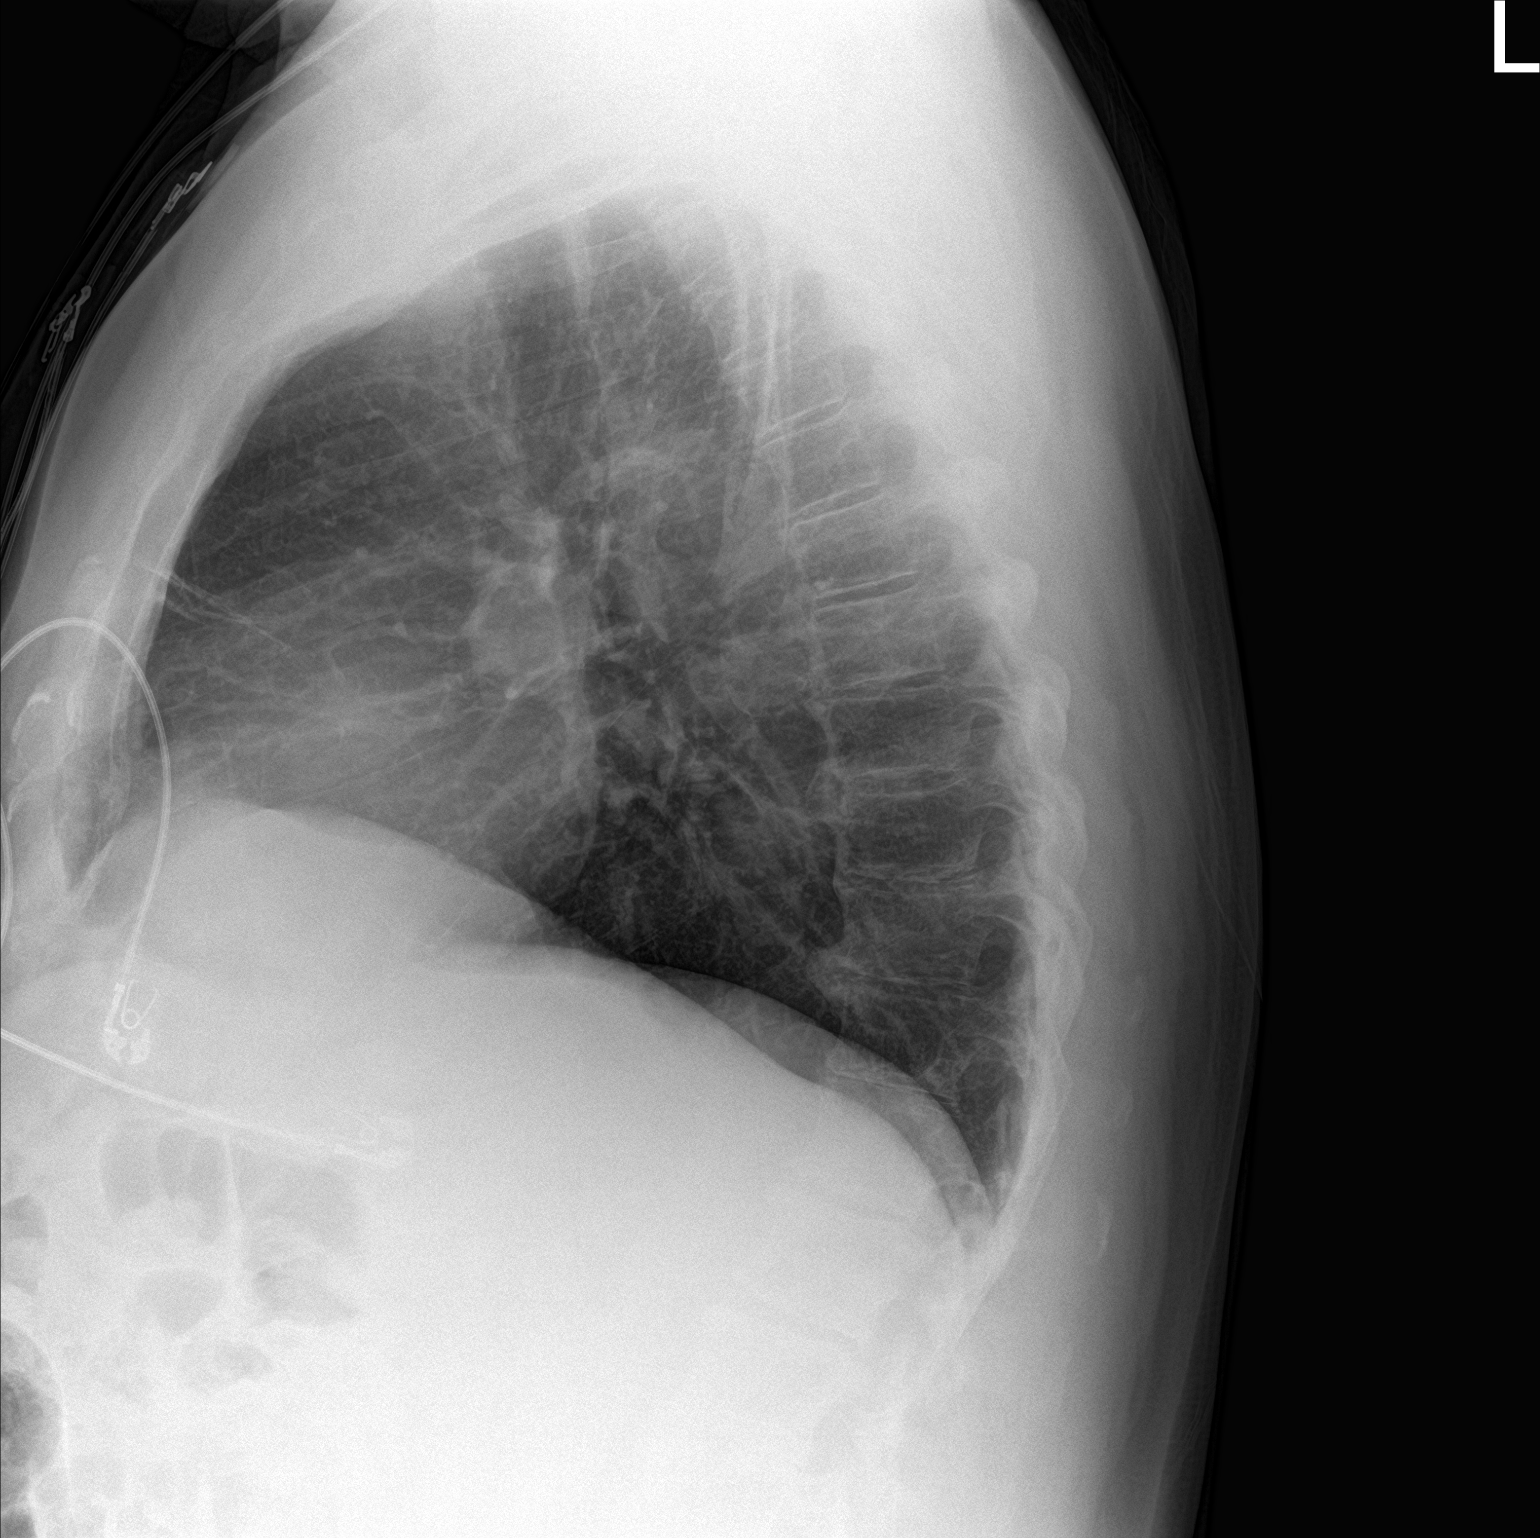

[2 of 2 positions shown; findings below may reference images not displayed]

FINDINGS: The heart size and mediastinal contours are within normal limits.
Both lungs are clear. The visualized skeletal structures are
unremarkable.
IMPRESSION: No active cardiopulmonary disease.

## 2021-06-11 ENCOUNTER — Other Ambulatory Visit: Payer: Self-pay

## 2021-07-02 ENCOUNTER — Other Ambulatory Visit: Payer: Self-pay

## 2021-07-02 MED ORDER — PANTOPRAZOLE SODIUM 40 MG PO TBEC: DELAYED_RELEASE_TABLET | ORAL | 3 refills | Status: DC

## 2021-07-02 MED ORDER — ROSUVASTATIN CALCIUM 20 MG PO TABS: ORAL_TABLET | ORAL | 3 refills | Status: DC

## 2021-07-05 ENCOUNTER — Other Ambulatory Visit: Payer: Self-pay

## 2021-07-05 ENCOUNTER — Ambulatory Visit (INDEPENDENT_AMBULATORY_CARE_PROVIDER_SITE_OTHER): Payer: Medicare Other | Admitting: Family Medicine

## 2021-07-05 ENCOUNTER — Encounter: Payer: Self-pay | Admitting: Family Medicine

## 2021-07-05 VITALS — BP 128/82 | HR 83 | Temp 97.8°F | Resp 18 | Ht 67.0 in | Wt 219.0 lb

## 2021-07-05 DIAGNOSIS — R002 Palpitations: Secondary | ICD-10-CM | POA: Diagnosis not present

## 2021-07-05 DIAGNOSIS — G8929 Other chronic pain: Secondary | ICD-10-CM | POA: Diagnosis not present

## 2021-07-05 DIAGNOSIS — M25511 Pain in right shoulder: Secondary | ICD-10-CM

## 2021-07-05 LAB — CBC WITH DIFFERENTIAL/PLATELET
Absolute Monocytes: 563 cells/uL (ref 200–950)
Basophils Absolute: 32 cells/uL (ref 0–200)
Basophils Relative: 0.5 %
Eosinophils Absolute: 83 cells/uL (ref 15–500)
Eosinophils Relative: 1.3 %
HCT: 45.8 % (ref 38.5–50.0)
Hemoglobin: 15.5 g/dL (ref 13.2–17.1)
Lymphs Abs: 1850 cells/uL (ref 850–3900)
MCH: 30.3 pg (ref 27.0–33.0)
MCHC: 33.8 g/dL (ref 32.0–36.0)
MCV: 89.6 fL (ref 80.0–100.0)
MPV: 10.6 fL (ref 7.5–12.5)
Monocytes Relative: 8.8 %
Neutro Abs: 3872 cells/uL (ref 1500–7800)
Neutrophils Relative %: 60.5 %
Platelets: 240 10*3/uL (ref 140–400)
RBC: 5.11 10*6/uL (ref 4.20–5.80)
RDW: 12.5 % (ref 11.0–15.0)
Total Lymphocyte: 28.9 %
WBC: 6.4 10*3/uL (ref 3.8–10.8)

## 2021-07-05 LAB — BASIC METABOLIC PANEL WITH GFR
BUN: 23 mg/dL (ref 7–25)
CO2: 34 mmol/L — ABNORMAL HIGH (ref 20–32)
Calcium: 10.2 mg/dL (ref 8.6–10.3)
Chloride: 99 mmol/L (ref 98–110)
Creat: 1.23 mg/dL (ref 0.70–1.28)
Glucose, Bld: 179 mg/dL — ABNORMAL HIGH (ref 65–99)
Potassium: 4.3 mmol/L (ref 3.5–5.3)
Sodium: 139 mmol/L (ref 135–146)
eGFR: 62 mL/min/{1.73_m2} (ref >=60)
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
## 2021-07-10 ENCOUNTER — Telehealth: Payer: Self-pay

## 2021-07-10 DIAGNOSIS — R899 Unspecified abnormal finding in specimens from other organs, systems and tissues: Secondary | ICD-10-CM

## 2021-07-10 NOTE — Telephone Encounter (Signed)
Left message for patient to call back regarding results and recommendations. °

## 2021-07-10 NOTE — Telephone Encounter (Signed)
-----   Message from Susy Frizzle, MD sent at 07/06/2021  7:00 AM EST ----- Random blood sugar is very high.  Please add hemoglobin A1c as this likely indicates diabetes that is not controlled.  Also bicarbonate is high suggesting likely metabolic compensation for respiratory acidosis.  This makes me concerned even more about sleep apnea.  Strongly would recommend sleep study.

## 2021-07-11 ENCOUNTER — Encounter: Payer: Self-pay | Admitting: Family Medicine

## 2021-07-12 NOTE — Telephone Encounter (Signed)
Informed patient of results.  Order placed for sleep study but patients says he does not think he will follow through.

## 2021-07-12 NOTE — Addendum Note (Signed)
Addended by: Amado Coe on: 07/12/2021 10:43 AM   Modules accepted: Orders

## 2021-07-14 ENCOUNTER — Other Ambulatory Visit: Payer: Self-pay | Admitting: Family Medicine

## 2021-07-20 ENCOUNTER — Other Ambulatory Visit: Payer: Self-pay

## 2021-07-20 ENCOUNTER — Other Ambulatory Visit: Payer: Self-pay | Admitting: Family Medicine

## 2021-07-20 MED ORDER — ROSUVASTATIN CALCIUM 20 MG PO TABS: ORAL_TABLET | ORAL | 3 refills | Status: DC

## 2021-07-20 MED ORDER — PANTOPRAZOLE SODIUM 40 MG PO TBEC: DELAYED_RELEASE_TABLET | ORAL | 3 refills | Status: DC

## 2021-07-20 NOTE — Telephone Encounter (Signed)
Pt had previously called and requested his rx's be sent to [facility] for mail order. These were sent. Per [facility] pt has to use [facility] for mail order services. Rx's have been resent to [facility].  [place] to advise.

## 2021-08-01 ENCOUNTER — Other Ambulatory Visit: Payer: Self-pay

## 2021-08-01 MED ORDER — DOXAZOSIN MESYLATE 4 MG PO TABS: 4.0000 mg | ORAL_TABLET | Freq: Every day | ORAL | 3 refills | Status: DC
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
## 2021-09-13 ENCOUNTER — Ambulatory Visit: Payer: TRICARE For Life (TFL) | Admitting: Family Medicine

## 2021-09-17 ENCOUNTER — Ambulatory Visit: Payer: Medicare Other

## 2021-09-17 ENCOUNTER — Other Ambulatory Visit: Payer: Self-pay | Admitting: Cardiology

## 2021-09-17 ENCOUNTER — Telehealth: Payer: Self-pay | Admitting: Physician Assistant

## 2021-09-17 ENCOUNTER — Other Ambulatory Visit: Payer: Self-pay | Admitting: Physician Assistant

## 2021-09-17 ENCOUNTER — Encounter: Payer: Self-pay | Admitting: Physician Assistant

## 2021-09-17 ENCOUNTER — Other Ambulatory Visit: Payer: Self-pay

## 2021-09-17 ENCOUNTER — Ambulatory Visit (INDEPENDENT_AMBULATORY_CARE_PROVIDER_SITE_OTHER): Payer: Medicare Other | Admitting: Physician Assistant

## 2021-09-17 VITALS — BP 162/94 | HR 77 | Ht 67.0 in | Wt 218.2 lb

## 2021-09-17 DIAGNOSIS — R002 Palpitations: Secondary | ICD-10-CM

## 2021-09-17 DIAGNOSIS — E785 Hyperlipidemia, unspecified: Secondary | ICD-10-CM | POA: Diagnosis not present

## 2021-09-17 DIAGNOSIS — Z8673 Personal history of transient ischemic attack (TIA), and cerebral infarction without residual deficits: Secondary | ICD-10-CM | POA: Diagnosis not present

## 2021-09-17 DIAGNOSIS — Z9189 Other specified personal risk factors, not elsewhere classified: Secondary | ICD-10-CM | POA: Diagnosis not present

## 2021-09-17 DIAGNOSIS — R079 Chest pain, unspecified: Secondary | ICD-10-CM | POA: Diagnosis not present

## 2021-09-17 DIAGNOSIS — I10 Essential (primary) hypertension: Secondary | ICD-10-CM

## 2021-09-17 MED ORDER — LOSARTAN POTASSIUM 100 MG PO TABS: 100.0000 mg | ORAL_TABLET | Freq: Every day | ORAL | 3 refills | Status: DC

## 2021-09-17 NOTE — Telephone Encounter (Signed)
PERCERT FOR HEART MONITOR  ?

## 2021-09-17 NOTE — Patient Instructions (Signed)
Medication Instructions:  ?Increase Losartan to 100 mg tablets daily ? ?Labwork: ?In TWO weeks: ?BMET ? ?Follow-Up: ?Follow up with Dr. Rozann Lesches in 6 months ? ?Any Other Special Instructions Will Be Listed Below (If Applicable). ? ?You have been referred to Pulmonology for: Sleep Apnea Assessment  ? ? ? ?If you need a refill on your cardiac medications before your next appointment, please call your pharmacy. ? ?ZIO XT- Long Term Monitor Instructions  ? ?Your physician has requested you wear your ZIO patch monitor___14____days.  ? ?This is a single patch monitor.  [employer] supplies one patch monitor per enrollment.  Additional stickers are not available. ?  ?Please do not apply patch if you will be having a Nuclear Stress Test, Echocardiogram, Cardiac CT, MRI, or Chest Xray during the time frame you would be wearing the monitor. The patch cannot be worn during these tests.  You cannot remove and re-apply the ZIO XT patch monitor. ?  ?Your ZIO patch monitor will be sent USPS Priority mail from [facility] directly to your home address. The monitor may also be mailed to a PO BOX if home delivery is not available.   It may take 3-5 days to receive your monitor after you have been enrolled. ?  ?Once you have received you monitor, please review enclosed instructions.  Your monitor has already been registered assigning a specific monitor serial # to you. ?  ?Applying the monitor  ? ?Shave hair from upper left chest. ?  ?Hold abrader disc by orange tab.  Rub abrader in 40 strokes over left upper chest as indicated in your monitor instructions. ?  ?Clean area with 4 enclosed alcohol pads .  Use all pads to assure are is cleaned thoroughly.  Let dry.  ? ?Apply patch as indicated in monitor instructions.  Patch will be place under collarbone on left side of chest with arrow pointing upward. ?  ?Rub patch adhesive wings for 2 minutes.Remove white label marked "1".  Remove white label marked "2".  Rub patch  adhesive wings for 2 additional minutes. ?  ?While looking in a mirror, press and release button in center of patch.  A small green light will flash 3-4 times .  This will be your only indicator the monitor has been turned on. ?    ?Do not shower for the first 24 hours.  You may shower after the first 24 hours. ?  ?Press button if you feel a symptom. You will hear a small click.  Record Date, Time and Symptom in the Patient Log Book. ?  ?When you are ready to remove patch, follow instructions on last 2 pages of Patient Log Book.  Stick patch monitor onto last page of Patient Log Book. ?  ?Place Patient Log Book in [facility] box.  Use locking tab on box and tape box closed securely.  The [employer] has [employer] on it.  Please place in mailbox as soon as possible.  Your physician should have your test results approximately 7 days after the monitor has been mailed back to [facility]. ?  ?Call [facility] at [phone] if you have questions regarding your ZIO XT patch monitor.  Call them immediately if you see an orange light blinking on your monitor. ?  ?If your monitor falls off in less than 4 days contact our Monitor department at [phone].  If your monitor becomes loose or falls off after 4 days call [employer] at [phone] for suggestions  on securing your monitor.  ? ? ? ?Two Gram Sodium Diet 2000 mg ? ?What is Sodium? Sodium is a mineral found naturally in many foods. The most significant source of sodium in the diet is table salt, which is about 40% sodium.  Processed, convenience, and preserved foods also contain a large amount of sodium.  The body needs only 500 mg of sodium daily to function,  A normal diet provides more than enough sodium even if you do not use salt. ? ?Why Limit Sodium? A build up of sodium in the body can cause thirst, increased blood pressure, shortness of breath, and water retention.  Decreasing sodium in the diet can reduce edema and risk of  heart attack or stroke associated with high blood pressure.  Keep in mind that there are many other factors involved in these health problems.  Heredity, obesity, lack of exercise, cigarette smoking, stress and what you eat all play a role. ? ?General Guidelines: ?Do not add salt at the table or in cooking.  One teaspoon of salt contains over 2 grams of sodium. ?Read food labels ?Avoid processed and convenience foods ?Ask your dietitian before eating any foods not dicussed in the menu planning guidelines ?Consult your physician if you wish to use a salt substitute or a sodium containing medication such as antacids.  Limit milk and milk products to 16 oz (2 cups) per day. ? ?Shopping Hints: ?READ LABELS!! "Dietetic" does not necessarily mean low sodium. ?Salt and other sodium ingredients are often added to foods during processing. ? ? ? ?Menu Planning Guidelines ?Food Group Choose More Often Avoid  ?Beverages (see also the milk group All fruit juices, low-sodium, salt-free vegetables juices, low-sodium carbonated beverages Regular vegetable or tomato juices, commercially softened water used for drinking or cooking  ?Breads and Cereals Enriched white, wheat, rye and pumpernickel bread, hard rolls and dinner rolls; muffins, cornbread and waffles; most dry cereals, cooked cereal without added salt; unsalted crackers and breadsticks; low sodium or homemade bread crumbs Bread, rolls and crackers with salted tops; quick breads; instant hot cereals; pancakes; commercial bread stuffing; self-rising flower and biscuit mixes; regular bread crumbs or cracker crumbs  ?Desserts and Sweets Desserts and sweets mad with mild should be within allowance Instant pudding mixes and cake mixes  ?Fats Butter or margarine; vegetable oils; unsalted salad dressings, regular salad dressings limited to 1 Tbs; light, sour and heavy cream Regular salad dressings containing bacon fat, bacon bits, and salt pork; snack dips made with instant soup  mixes or processed cheese; salted nuts  ?Fruits Most fresh, frozen and canned fruits Fruits processed with salt or sodium-containing ingredient (some dried fruits are processed with sodium sulfites  ? ? ? ? ? ? ?Vegetables Fresh, frozen vegetables and low- sodium canned vegetables Regular canned vegetables, sauerkraut, pickled vegetables, and others prepared in brine; frozen vegetables in sauces; vegetables seasoned with ham, bacon or salt pork  ?Condiments, Sauces, Miscellaneous ? Salt substitute with physician's approval; pepper, herbs, spices; vinegar, lemon or lime juice; hot pepper sauce; garlic powder, onion powder, low sodium soy sauce (1 Tbs.); low sodium condiments (ketchup, chili sauce, mustard) in limited amounts (1 tsp.) fresh ground horseradish; unsalted tortilla chips, pretzels, potato chips, popcorn, salsa (1/4 cup) Any seasoning made with salt including garlic salt, celery salt, onion salt, and seasoned salt; sea salt, rock salt, kosher salt; meat tenderizers; monosodium glutamate; mustard, regular soy sauce, barbecue, sauce, chili sauce, teriyaki sauce, steak sauce, Worcestershire sauce, and most flavored vinegars; canned  gravy and mixes; regular condiments; salted snack foods, olives, picles, relish, horseradish sauce, catsup  ? ?Food preparation: Try these seasonings ?Meats:    ?Pork Sage, onion Serve with applesauce  ?Chicken Poultry seasoning, thyme, parsley Serve with cranberry sauce  ?Lamb Curry powder, rosemary, garlic, thyme Serve with mint sauce or jelly  ?Veal Marjoram, basil Serve with current jelly, cranberry sauce  ?Beef Pepper, bay leaf Serve with dry mustard, unsalted chive butter  ?Fish [place] leaf, dill Serve with unsalted lemon butter, unsalted parsley butter  ?Vegetables:    ?Asparagus Lemon juice   ?Broccoli Lemon juice   ?Carrots Mustard dressing parsley, mint, nutmeg, glazed with unsalted butter and sugar   ?Green beans Marjoram, lemon juice, nutmeg,dill seed   ?Tomatoes Basil,  marjoram, onion   ?Spice /blend for "Salt Shaker" 4 tsp ground thyme ?1 tsp ground sage 3 tsp ground rosemary ?4 tsp ground marjoram  ? ?Test your knowledge ?A product that says "Salt Free" may still contain sodium

## 2021-10-17 ENCOUNTER — Other Ambulatory Visit: Payer: Self-pay

## 2021-10-17 MED ORDER — ROSUVASTATIN CALCIUM 20 MG PO TABS: ORAL_TABLET | ORAL | 3 refills | Status: DC

## 2021-10-17 MED ORDER — PANTOPRAZOLE SODIUM 40 MG PO TBEC: DELAYED_RELEASE_TABLET | ORAL | 3 refills | Status: DC

## 2021-11-06 ENCOUNTER — Other Ambulatory Visit: Payer: Self-pay | Admitting: Family Medicine

## 2021-11-07 NOTE — Telephone Encounter (Signed)
Requested medication (s) are due for refill today: Yes ? ?Requested medication (s) are on the active medication list: Yes ? ?Last refill:  08/14/20 ? ?Future visit scheduled: No ? ?Notes to clinic:  prescription expired. ? ? ? ?Requested Prescriptions  ?Pending Prescriptions Disp Refills  ? hydrochlorothiazide (HYDRODIURIL) 25 MG tablet [Pharmacy Med Name: HYDROCHLOROTHIAZIDE '25MG'$  TABLETS] 90 tablet 3  ?  Sig: TAKE 1 TABLET(25 MG) BY MOUTH DAILY  ?  ? Cardiovascular: Diuretics - Thiazide Failed - 11/06/2021  3:37 AM  ?  ?  Failed - Last BP in normal range  ?  BP Readings from Last 1 Encounters:  ?09/17/21 (!) 162/94  ?   ?  ?  Passed - Cr in normal range and within 180 days  ?  Creat  ?Date Value Ref Range Status  ?07/05/2021 1.23 0.70 - 1.28 mg/dL Final  ?   ?  ?  Passed - K in normal range and within 180 days  ?  Potassium  ?Date Value Ref Range Status  ?07/05/2021 4.3 3.5 - 5.3 mmol/L Final  ?   ?  ?  Passed - Na in normal range and within 180 days  ?  Sodium  ?Date Value Ref Range Status  ?07/05/2021 139 135 - 146 mmol/L Final  ?   ?  ?  Passed - Valid encounter within last 6 months  ?  Recent Outpatient Visits   ? ?      ? 4 months ago Chronic right shoulder pain  ? [facility] Pick[place], Cammie Mcgee, MD  ? 7 months ago Need for immunization against influenza  ? [facility] Pickard, Cammie Mcgee, MD  ? 1 year ago Elevated blood sugar  ? [facility] Pickard, Cammie Mcgee, MD  ? 2 years ago General medical exam  ? [facility] Susy Frizzle, MD  ? 2 years ago Chest pain, unspecified type  ? [facility] Pickard, Cammie Mcgee, MD  ? ?  ?  ? ? ?  ?  ?  ? ?

## 2021-11-12 ENCOUNTER — Other Ambulatory Visit: Payer: Self-pay | Admitting: Family Medicine

## 2021-11-13 NOTE — Telephone Encounter (Signed)
Requested Prescriptions  Pending Prescriptions Disp Refills  . amLODipine (NORVASC) 10 MG tablet [Pharmacy Med Name: AMLODIPINE BESYLATE '10MG'$  TABLETS] 90 tablet 0    Sig: TAKE 1 TABLET(10 MG) BY MOUTH DAILY     Cardiovascular: Calcium Channel Blockers 2 Failed - 11/12/2021  3:35 AM      Failed - Last BP in normal range    BP Readings from Last 1 Encounters:  09/17/21 (!) 162/94         Passed - Last Heart Rate in normal range    Pulse Readings from Last 1 Encounters:  09/17/21 77         Passed - Valid encounter within last 6 months    Recent Outpatient Visits          4 months ago Chronic right shoulder pain   [place] [facility] Schaumann, Cammie Mcgee, MD   8 months ago Need for immunization against influenza   [place] Pickard, Cammie Mcgee, MD   1 year ago Elevated blood sugar   [place] Pickard, Cammie Mcgee, MD   2 years ago General medical exam   [place], Warren T, MD   2 years ago Chest pain, unspecified type   [place] Pickard, Cammie Mcgee, MD

## 2022-01-10 ENCOUNTER — Encounter: Payer: Self-pay | Admitting: Internal Medicine

## 2022-01-30 ENCOUNTER — Other Ambulatory Visit: Payer: Self-pay | Admitting: Family Medicine

## 2022-01-31 NOTE — Telephone Encounter (Signed)
Requested medication (s) are due for refill today: yes  Requested medication (s) are on the active medication list: yes  Last refill:  11/13/21 #90 with 0 RF, curtesy refill asked to make appt  Future visit scheduled: no  Notes to clinic:  Failed protocol due to no valid visit within 6  months, last seen 07/05/21, already given curtesy refill and did not schedule appt, please assess.       Requested Prescriptions  Pending Prescriptions Disp Refills   amLODipine (NORVASC) 10 MG tablet [Pharmacy Med Name: AMLODIPINE BESYLATE '10MG'$  TABLETS] 90 tablet 0    Sig: TAKE 1 TABLET(10 MG) BY MOUTH DAILY     Cardiovascular: Calcium Channel Blockers 2 Failed - 01/30/2022  6:11 PM      Failed - Last BP in normal range    BP Readings from Last 1 Encounters:  09/17/21 (!) 162/94         Failed - Valid encounter within last 6 months    Recent Outpatient Visits           7 months ago Chronic right shoulder pain   [place] [facility] Schaumann, Cammie Mcgee, MD   10 months ago Need for immunization against influenza   [place], Cammie Mcgee, MD   1 year ago Elevated blood sugar   [place] Denn[facility] Schaumann, Cammie Mcgee, MD   2 years ago General medical exam   [place], Warren T, MD   3 years ago Chest pain, unspecified type   [place] Susy Frizzle, MD              Passed - Last Heart Rate in normal range    Pulse Readings from Last 1 Encounters:  09/17/21 77

## 2022-04-30 ENCOUNTER — Other Ambulatory Visit: Payer: Self-pay | Admitting: Family Medicine

## 2022-05-08 ENCOUNTER — Telehealth: Payer: Self-pay | Admitting: Family Medicine

## 2022-05-08 NOTE — Telephone Encounter (Signed)
Left message for patient to call back and schedule Medicare Annual Wellness Visit (AWV) either virtually or in office. Left  my Joseph Dillon number [phone]   Last AWV 02/10/20 please schedule with Nurse Health Adviser   45 min for awv-i and in office appointments 30 min for awv-s  phone/virtual appointments

## 2022-05-30 ENCOUNTER — Telehealth: Payer: Self-pay | Admitting: Family Medicine

## 2022-05-30 NOTE — Telephone Encounter (Signed)
Left message for patient to call back and schedule Medicare Annual Wellness Visit (AWV) in office.   If not able to come in office, please offer to do virtually or by telephone.   Last AWV: 02/10/2020   Please schedule at anytime with [facility] [place]  If any questions, please contact me at [phone].  Thank you ,  Colletta [place]

## 2022-07-15 ENCOUNTER — Telehealth: Payer: Self-pay | Admitting: Family Medicine

## 2022-07-15 NOTE — Telephone Encounter (Signed)
Left message for patient to call back and schedule Medicare Annual Wellness Visit (AWV) in office.   If not able to come in office, please offer to do virtually or by telephone.   Last AWV: 02/10/2020   Please schedule at any time with [facility]-Nurse Health Advisor.  30 minute appointment  Any questions, please contact me at [phone]   Thank you,   [facility]  Ambulatory Clinical Support for [place] [facility] ??[phone] or ??[phone]

## 2022-07-24 ENCOUNTER — Other Ambulatory Visit: Payer: Self-pay | Admitting: Family Medicine

## 2022-07-24 NOTE — Telephone Encounter (Signed)
Patient needs OV, will refill medication for 30 days until OV can be made. Patient needs OV for additional refills.  Requested Prescriptions  Pending Prescriptions Disp Refills   doxazosin (CARDURA) 4 MG tablet [Pharmacy Med Name: DOXAZOSIN '4MG'$  TABLETS] 30 tablet 0    Sig: TAKE 1 TABLET(4 MG) BY MOUTH DAILY     Cardiovascular:  Alpha Blockers Failed - 07/24/2022  3:34 AM      Failed - Last BP in normal range    BP Readings from Last 1 Encounters:  09/17/21 (!) 162/94         Failed - Valid encounter within last 6 months    Recent Outpatient Visits           1 year ago Chronic right shoulder pain   [place] Susy Frizzle, MD   1 year ago Need for immunization against influenza   [place], Warren T, MD   2 years ago Elevated blood sugar   [place] [facility]enn[facility] Schaumann, Cammie Mcgee, MD   3 years ago General medical exam   [place], Warren T, MD   3 years ago Chest pain, unspecified type   [place] Pickard, Cammie Mcgee, MD       Future Appointments             In 1 month Pickard, Cammie Mcgee, MD [place]

## 2022-08-04 ENCOUNTER — Other Ambulatory Visit: Payer: Self-pay | Admitting: Family Medicine

## 2022-08-05 ENCOUNTER — Other Ambulatory Visit: Payer: Self-pay | Admitting: Family Medicine

## 2022-08-05 NOTE — Patient Instructions (Incomplete)
Joseph Dillon , Thank you for taking time to come for your Medicare Wellness Visit. I appreciate your ongoing commitment to your health goals. Please review the following plan we discussed and let me know if I can assist you in the future.   These are the goals we discussed:  Goals   None     This is a list of the screening recommended for you and due dates:  Health Maintenance  Topic Date Due   DTaP/Tdap/Td vaccine (1 - Tdap) Never done   Zoster (Shingles) Vaccine (1 of 2) Never done   Flu Shot  01/22/2022   COVID-19 Vaccine (4 - 2023-24 season) 02/22/2022   Medicare Annual Wellness Visit  03/13/2022   Colon Cancer Screening  03/22/2031   Pneumonia Vaccine  Completed   Hepatitis C Screening: USPSTF Recommendation to screen - Ages 18-75 yo.  Completed   HPV Vaccine  Aged Out    Advanced directives:   Conditions/risks identified: Aim for 30 minutes of exercise or brisk walking, 6-8 glasses of water, and 5 servings of fruits and vegetables each day.  Next appointment: Follow up in one year for your annual wellness visit.   Preventive Care 75 Years and Older, Male  Preventive care refers to lifestyle choices and visits with your health care provider that can promote health and wellness. What does preventive care include? A yearly physical exam. This is also called an annual well check. Dental exams once or twice a year. Routine eye exams. Ask your health care provider how often you should have your eyes checked. Personal lifestyle choices, including: Daily care of your teeth and gums. Regular physical activity. Eating a healthy diet. Avoiding tobacco and drug use. Limiting alcohol use. Practicing safe sex. Taking low doses of aspirin every day. Taking vitamin and mineral supplements as recommended by your health care provider. What happens during an annual well check? The services and screenings done by your health care provider during your annual well check will depend on  your age, overall health, lifestyle risk factors, and family history of disease. Counseling  Your health care provider may ask you questions about your: Alcohol use. Tobacco use. Drug use. Emotional well-being. Home and relationship well-being. Sexual activity. Eating habits. History of falls. Memory and ability to understand (cognition). Work and work Statistician. Screening  You may have the following tests or measurements: Height, weight, and BMI. Blood pressure. Lipid and cholesterol levels. These may be checked every 5 years, or more frequently if you are over 68 years old. Skin check. Lung cancer screening. You may have this screening every year starting at age 75 if you have a 30-pack-year history of smoking and currently smoke or have quit within the past 15 years. Fecal occult blood test (FOBT) of the stool. You may have this test every year starting at age 75 Flexible sigmoidoscopy or colonoscopy. You may have a sigmoidoscopy every 5 years or a colonoscopy every 10 years starting at age 75 Prostate cancer screening. Recommendations will vary depending on your family history and other risks. Hepatitis C blood test. Hepatitis B blood test. Sexually transmitted disease (STD) testing. Diabetes screening. This is done by checking your blood sugar (glucose) after you have not eaten for a while (fasting). You may have this done every 1-3 years. Abdominal aortic aneurysm (AAA) screening. You may need this if you are a current or former smoker. Osteoporosis. You may be screened starting at age 40 if you are at high risk. Talk with your  health care provider about your test results, treatment options, and if necessary, the need for more tests. Vaccines  Your health care provider may recommend certain vaccines, such as: Influenza vaccine. This is recommended every year. Tetanus, diphtheria, and acellular pertussis (Tdap, Td) vaccine. You may need a Td booster every 10 years. Zoster  vaccine. You may need this after age 48. Pneumococcal 13-valent conjugate (PCV13) vaccine. One dose is recommended after age 75 Pneumococcal polysaccharide (PPSV23) vaccine. One dose is recommended after age 75 Talk to your health care provider about which screenings and vaccines you need and how often you need them. This information is not intended to replace advice given to you by your health care provider. Make sure you discuss any questions you have with your health care provider. Document Released: 07/07/2015 Document Revised: 02/28/2016 Document Reviewed: 04/11/2015 Elsevier Interactive Patient Education  2017 [place] Prevention in the Home Falls can cause injuries. They can happen to people of all ages. There are many things you can do to make your home safe and to help prevent falls. What can I do on the outside of my home? Regularly fix the edges of walkways and driveways and fix any cracks. Remove anything that might make you trip as you walk through a door, such as a raised step or threshold. Trim any bushes or trees on the path to your home. Use bright outdoor lighting. Clear any walking paths of anything that might make someone trip, such as rocks or tools. Regularly check to see if handrails are loose or broken. Make sure that both sides of any steps have handrails. Any raised decks and porches should have guardrails on the edges. Have any leaves, snow, or ice cleared regularly. Use sand or salt on walking paths during winter. Clean up any spills in your garage right away. This includes oil or grease spills. What can I do in the bathroom? Use night lights. Install grab bars by the toilet and in the tub and shower. Do not use towel bars as grab bars. Use non-skid mats or decals in the tub or shower. If you need to sit down in the shower, use a plastic, non-slip stool. Keep the floor dry. Clean up any water that spills on the floor as soon as it happens. Remove  soap buildup in the tub or shower regularly. Attach bath mats securely with double-sided non-slip rug tape. Do not have throw rugs and other things on the floor that can make you trip. What can I do in the bedroom? Use night lights. Make sure that you have a light by your bed that is easy to reach. Do not use any sheets or blankets that are too big for your bed. They should not hang down onto the floor. Have a firm chair that has side arms. You can use this for support while you get dressed. Do not have throw rugs and other things on the floor that can make you trip. What can I do in the kitchen? Clean up any spills right away. Avoid walking on wet floors. Keep items that you use a lot in easy-to-reach places. If you need to reach something above you, use a strong step stool that has a grab bar. Keep electrical cords out of the way. Do not use floor polish or wax that makes floors slippery. If you must use wax, use non-skid floor wax. Do not have throw rugs and other things on the floor that can make you trip. What  can I do with my stairs? Do not leave any items on the stairs. Make sure that there are handrails on both sides of the stairs and use them. Fix handrails that are broken or loose. Make sure that handrails are as long as the stairways. Check any carpeting to make sure that it is firmly attached to the stairs. Fix any carpet that is loose or worn. Avoid having throw rugs at the top or bottom of the stairs. If you do have throw rugs, attach them to the floor with carpet tape. Make sure that you have a light switch at the top of the stairs and the bottom of the stairs. If you do not have them, ask someone to add them for you. What else can I do to help prevent falls? Wear shoes that: Do not have high heels. Have rubber bottoms. Are comfortable and fit you well. Are closed at the toe. Do not wear sandals. If you use a stepladder: Make sure that it is fully opened. Do not climb a  closed stepladder. Make sure that both sides of the stepladder are locked into place. Ask someone to hold it for you, if possible. Clearly mark and make sure that you can see: Any grab bars or handrails. First and last steps. Where the edge of each step is. Use tools that help you move around (mobility aids) if they are needed. These include: Canes. Walkers. Scooters. Crutches. Turn on the lights when you go into a dark area. Replace any light bulbs as soon as they burn out. Set up your furniture so you have a clear path. Avoid moving your furniture around. If any of your floors are uneven, fix them. If there are any pets around you, be aware of where they are. Review your medicines with your doctor. Some medicines can make you feel dizzy. This can increase your chance of falling. Ask your doctor what other things that you can do to help prevent falls. This information is not intended to replace advice given to you by your health care provider. Make sure you discuss any questions you have with your health care provider. Document Released: 04/06/2009 Document Revised: 11/16/2015 Document Reviewed: 07/15/2014 Elsevier Interactive Patient Education  2017 [employer].
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
## 2022-08-14 NOTE — Patient Instructions (Incomplete)
Joseph Dillon , Thank you for taking time to come for your Medicare Wellness Visit. I appreciate your ongoing commitment to your health goals. Please review the following plan we discussed and let me know if I can assist you in the future.   These are the goals we discussed:  Goals   None     This is a list of the screening recommended for you and due dates:  Health Maintenance  Topic Date Due   DTaP/Tdap/Td vaccine (1 - Tdap) Never done   Zoster (Shingles) Vaccine (1 of 2) Never done   Flu Shot  01/22/2022   COVID-19 Vaccine (4 - 2023-24 season) 02/22/2022   Medicare Annual Wellness Visit  03/13/2022   Colon Cancer Screening  03/22/2031   Pneumonia Vaccine  Completed   Hepatitis C Screening: USPSTF Recommendation to screen - Ages 18-79 yo.  Completed   HPV Vaccine  Aged Out    Advanced directives: ***  Conditions/risks identified: Aim for 30 minutes of exercise or brisk walking, 6-8 glasses of water, and 5 servings of fruits and vegetables each day.   Next appointment: Follow up in one year for your annual wellness visit.   Preventive Care 10 Years and Older, Male  Preventive care refers to lifestyle choices and visits with your health care provider that can promote health and wellness. What does preventive care include? A yearly physical exam. This is also called an annual well check. Dental exams once or twice a year. Routine eye exams. Ask your health care provider how often you should have your eyes checked. Personal lifestyle choices, including: Daily care of your teeth and gums. Regular physical activity. Eating a healthy diet. Avoiding tobacco and drug use. Limiting alcohol use. Practicing safe sex. Taking low doses of aspirin every day. Taking vitamin and mineral supplements as recommended by your health care provider. What happens during an annual well check? The services and screenings done by your health care provider during your annual well check will depend on  your age, overall health, lifestyle risk factors, and family history of disease. Counseling  Your health care provider may ask you questions about your: Alcohol use. Tobacco use. Drug use. Emotional well-being. Home and relationship well-being. Sexual activity. Eating habits. History of falls. Memory and ability to understand (cognition). Work and work Statistician. Screening  You may have the following tests or measurements: Height, weight, and BMI. Blood pressure. Lipid and cholesterol levels. These may be checked every 5 years, or more frequently if you are over 27 years old. Skin check. Lung cancer screening. You may have this screening every year starting at age 64 if you have a 30-pack-year history of smoking and currently smoke or have quit within the past 15 years. Fecal occult blood test (FOBT) of the stool. You may have this test every year starting at age 83. Flexible sigmoidoscopy or colonoscopy. You may have a sigmoidoscopy every 5 years or a colonoscopy every 10 years starting at age 61. Prostate cancer screening. Recommendations will vary depending on your family history and other risks. Hepatitis C blood test. Hepatitis B blood test. Sexually transmitted disease (STD) testing. Diabetes screening. This is done by checking your blood sugar (glucose) after you have not eaten for a while (fasting). You may have this done every 1-3 years. Abdominal aortic aneurysm (AAA) screening. You may need this if you are a current or former smoker. Osteoporosis. You may be screened starting at age 110 if you are at high risk. Talk with  your health care provider about your test results, treatment options, and if necessary, the need for more tests. Vaccines  Your health care provider may recommend certain vaccines, such as: Influenza vaccine. This is recommended every year. Tetanus, diphtheria, and acellular pertussis (Tdap, Td) vaccine. You may need a Td booster every 10 years. Zoster  vaccine. You may need this after age 10. Pneumococcal 13-valent conjugate (PCV13) vaccine. One dose is recommended after age 9. Pneumococcal polysaccharide (PPSV23) vaccine. One dose is recommended after age 73. Talk to your health care provider about which screenings and vaccines you need and how often you need them. This information is not intended to replace advice given to you by your health care provider. Make sure you discuss any questions you have with your health care provider. Document Released: 07/07/2015 Document Revised: 02/28/2016 Document Reviewed: 04/11/2015 Elsevier Interactive Patient Education  2017 [place] Prevention in the Home Falls can cause injuries. They can happen to people of all ages. There are many things you can do to make your home safe and to help prevent falls. What can I do on the outside of my home? Regularly fix the edges of walkways and driveways and fix any cracks. Remove anything that might make you trip as you walk through a door, such as a raised step or threshold. Trim any bushes or trees on the path to your home. Use bright outdoor lighting. Clear any walking paths of anything that might make someone trip, such as rocks or tools. Regularly check to see if handrails are loose or broken. Make sure that both sides of any steps have handrails. Any raised decks and porches should have guardrails on the edges. Have any leaves, snow, or ice cleared regularly. Use sand or salt on walking paths during winter. Clean up any spills in your garage right away. This includes oil or grease spills. What can I do in the bathroom? Use night lights. Install grab bars by the toilet and in the tub and shower. Do not use towel bars as grab bars. Use non-skid mats or decals in the tub or shower. If you need to sit down in the shower, use a plastic, non-slip stool. Keep the floor dry. Clean up any water that spills on the floor as soon as it happens. Remove  soap buildup in the tub or shower regularly. Attach bath mats securely with double-sided non-slip rug tape. Do not have throw rugs and other things on the floor that can make you trip. What can I do in the bedroom? Use night lights. Make sure that you have a light by your bed that is easy to reach. Do not use any sheets or blankets that are too big for your bed. They should not hang down onto the floor. Have a firm chair that has side arms. You can use this for support while you get dressed. Do not have throw rugs and other things on the floor that can make you trip. What can I do in the kitchen? Clean up any spills right away. Avoid walking on wet floors. Keep items that you use a lot in easy-to-reach places. If you need to reach something above you, use a strong step stool that has a grab bar. Keep electrical cords out of the way. Do not use floor polish or wax that makes floors slippery. If you must use wax, use non-skid floor wax. Do not have throw rugs and other things on the floor that can make you trip.  What can I do with my stairs? Do not leave any items on the stairs. Make sure that there are handrails on both sides of the stairs and use them. Fix handrails that are broken or loose. Make sure that handrails are as long as the stairways. Check any carpeting to make sure that it is firmly attached to the stairs. Fix any carpet that is loose or worn. Avoid having throw rugs at the top or bottom of the stairs. If you do have throw rugs, attach them to the floor with carpet tape. Make sure that you have a light switch at the top of the stairs and the bottom of the stairs. If you do not have them, ask someone to add them for you. What else can I do to help prevent falls? Wear shoes that: Do not have high heels. Have rubber bottoms. Are comfortable and fit you well. Are closed at the toe. Do not wear sandals. If you use a stepladder: Make sure that it is fully opened. Do not climb a  closed stepladder. Make sure that both sides of the stepladder are locked into place. Ask someone to hold it for you, if possible. Clearly mark and make sure that you can see: Any grab bars or handrails. First and last steps. Where the edge of each step is. Use tools that help you move around (mobility aids) if they are needed. These include: Canes. Walkers. Scooters. Crutches. Turn on the lights when you go into a dark area. Replace any light bulbs as soon as they burn out. Set up your furniture so you have a clear path. Avoid moving your furniture around. If any of your floors are uneven, fix them. If there are any pets around you, be aware of where they are. Review your medicines with your doctor. Some medicines can make you feel dizzy. This can increase your chance of falling. Ask your doctor what other things that you can do to help prevent falls. This information is not intended to replace advice given to you by your health care provider. Make sure you discuss any questions you have with your health care provider. Document Released: 04/06/2009 Document Revised: 11/16/2015 Document Reviewed: 07/15/2014 Elsevier Interactive Patient Education  2017 [employer].

## 2022-08-15 ENCOUNTER — Ambulatory Visit (INDEPENDENT_AMBULATORY_CARE_PROVIDER_SITE_OTHER): Payer: Medicare Other

## 2022-08-15 VITALS — Ht 67.0 in | Wt 219.0 lb

## 2022-08-15 DIAGNOSIS — Z0000 Encounter for general adult medical examination without abnormal findings: Secondary | ICD-10-CM

## 2022-08-23 ENCOUNTER — Encounter: Payer: Self-pay | Admitting: Family Medicine

## 2022-08-23 ENCOUNTER — Ambulatory Visit (INDEPENDENT_AMBULATORY_CARE_PROVIDER_SITE_OTHER): Payer: Medicare Other | Admitting: Family Medicine

## 2022-08-23 VITALS — BP 124/62 | HR 74 | Temp 98.6°F | Ht 67.0 in | Wt 217.0 lb

## 2022-08-23 DIAGNOSIS — Z86010 Personal history of colonic polyps: Secondary | ICD-10-CM | POA: Diagnosis not present

## 2022-08-23 DIAGNOSIS — Z0000 Encounter for general adult medical examination without abnormal findings: Secondary | ICD-10-CM

## 2022-08-23 DIAGNOSIS — I10 Essential (primary) hypertension: Secondary | ICD-10-CM | POA: Diagnosis not present

## 2022-08-23 DIAGNOSIS — Z0001 Encounter for general adult medical examination with abnormal findings: Secondary | ICD-10-CM

## 2022-08-23 DIAGNOSIS — Z125 Encounter for screening for malignant neoplasm of prostate: Secondary | ICD-10-CM
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
## 2022-08-24 ENCOUNTER — Other Ambulatory Visit: Payer: Self-pay | Admitting: Family Medicine

## 2022-08-24 LAB — CBC WITH DIFFERENTIAL/PLATELET
Absolute Monocytes: 523 cells/uL (ref 200–950)
Basophils Absolute: 47 cells/uL (ref 0–200)
Basophils Relative: 0.7 %
Eosinophils Absolute: 101 cells/uL (ref 15–500)
Eosinophils Relative: 1.5 %
HCT: 43 % (ref 38.5–50.0)
Hemoglobin: 14.9 g/dL (ref 13.2–17.1)
Lymphs Abs: 2023 cells/uL (ref 850–3900)
MCH: 30.3 pg (ref 27.0–33.0)
MCHC: 34.7 g/dL (ref 32.0–36.0)
MCV: 87.6 fL (ref 80.0–100.0)
MPV: 10.7 fL (ref 7.5–12.5)
Monocytes Relative: 7.8 %
Neutro Abs: 4007 cells/uL (ref 1500–7800)
Neutrophils Relative %: 59.8 %
Platelets: 241 10*3/uL (ref 140–400)
RBC: 4.91 10*6/uL (ref 4.20–5.80)
RDW: 12.4 % (ref 11.0–15.0)
Total Lymphocyte: 30.2 %
WBC: 6.7 10*3/uL (ref 3.8–10.8)

## 2022-08-24 LAB — COMPLETE METABOLIC PANEL WITH GFR
AG Ratio: 1.9 (calc) (ref 1.0–2.5)
ALT: 36 U/L (ref 9–46)
AST: 28 U/L (ref 10–35)
Albumin: 4.6 g/dL (ref 3.6–5.1)
Alkaline phosphatase (APISO): 64 U/L (ref 35–144)
BUN: 21 mg/dL (ref 7–25)
CO2: 32 mmol/L (ref 20–32)
Calcium: 10 mg/dL (ref 8.6–10.3)
Chloride: 101 mmol/L (ref 98–110)
Creat: 1.26 mg/dL (ref 0.70–1.28)
Globulin: 2.4 g/dL (calc) (ref 1.9–3.7)
Glucose, Bld: 117 mg/dL — ABNORMAL HIGH (ref 65–99)
Potassium: 4.3 mmol/L (ref 3.5–5.3)
Sodium: 140 mmol/L (ref 135–146)
Total Bilirubin: 0.8 mg/dL (ref 0.2–1.2)
Total Protein: 7 g/dL (ref 6.1–8.1)
eGFR: 59 mL/min/{1.73_m2} — ABNORMAL LOW (ref >=60)

## 2022-08-24 LAB — LIPID PANEL
Cholesterol: 141 mg/dL (ref <200)
HDL: 61 mg/dL (ref >=40)
LDL Cholesterol (Calc): 67 mg/dL (calc)
Non-HDL Cholesterol (Calc): 80 mg/dL (calc) (ref <130)
Total CHOL/HDL Ratio: 2.3 (calc) (ref <5.0)
Triglycerides: 58 mg/dL (ref <150)

## 2022-08-24 LAB — PSA: PSA: 3.41 ng/mL (ref <=4.00)

## 2022-08-26 ENCOUNTER — Other Ambulatory Visit: Payer: Self-pay | Admitting: Family Medicine

## 2022-09-24 ENCOUNTER — Other Ambulatory Visit: Payer: Self-pay | Admitting: Family Medicine

## 2022-09-24 NOTE — Telephone Encounter (Signed)
Requested medication (s) are due for refill today -no  Requested medication (s) are on the active medication list -yes  Future visit scheduled -no  Last refill: 09/24/22  Notes to clinic: Pharmacy request 90 day supply- Rx from office does not cover that- sent for review   Requested Prescriptions  Pending Prescriptions Disp Refills   doxazosin (CARDURA) 4 MG tablet [Pharmacy Med Name: DOXAZOSIN 4MG  TABLETS] 90 tablet     Sig: TAKE 1 TABLET(4 MG) BY MOUTH DAILY     Cardiovascular:  Alpha Blockers Failed - 09/24/2022  1:37 PM      Failed - Valid encounter within last 6 months    Recent Outpatient Visits           1 year ago Chronic right shoulder pain   [place] Pickard, Cammie Mcgee, MD   1 year ago Need for immunization against influenza   [place], Warren T, MD   2 years ago Elevated blood sugar   [place] Pick[facility], Cammie Mcgee, MD   3 years ago General medical exam   [place] Susy Frizzle, MD   3 years ago Chest pain, unspecified type   [place] Pickard, Cammie Mcgee, MD              Passed - Last BP in normal range    BP Readings from Last 1 Encounters:  08/23/22 124/62            Requested Prescriptions  Pending Prescriptions Disp Refills   doxazosin (CARDURA) 4 MG tablet [Pharmacy Med Name: DOXAZOSIN 4MG  TABLETS] 90 tablet     Sig: TAKE 1 TABLET(4 MG) BY MOUTH DAILY     Cardiovascular:  Alpha Blockers Failed - 09/24/2022  1:37 PM      Failed - Valid encounter within last 6 months    Recent Outpatient Visits           1 year ago Chronic right shoulder pain   [place] Susy Frizzle, MD   1 year ago Need for immunization against influenza   [place] Susy Frizzle, MD   2 years ago Elevated blood sugar   [place] [facility] [facility], Cammie Mcgee, MD   3 years ago General medical exam   [place], Warren T, MD   3 years ago Chest pain, unspecified type   [place] Pickard, Cammie Mcgee, MD              Passed - Last BP in normal range    BP Readings from Last 1 Encounters:  08/23/22 124/62

## 2022-10-01 ENCOUNTER — Other Ambulatory Visit: Payer: Self-pay | Admitting: Physician Assistant

## 2022-10-02 ENCOUNTER — Other Ambulatory Visit: Payer: Self-pay | Admitting: Family Medicine

## 2022-10-02 NOTE — Telephone Encounter (Signed)
Requested medication (s) are due for refill today: yes  Requested medication (s) are on the active medication list: yes  Last refill:  08/05/22 #60 0 refills  Future visit scheduled: no  Notes to clinic:  last OV 08/23/22 no refills remain. Do you want to refill Rx?     Requested Prescriptions  Pending Prescriptions Disp Refills   amLODipine (NORVASC) 10 MG tablet [Pharmacy Med Name: AMLODIPINE BESYLATE 10MG  TABLETS] 60 tablet 0    Sig: TAKE 1 TABLET(10 MG) BY MOUTH DAILY     Cardiovascular: Calcium Channel Blockers 2 Failed - 10/02/2022  4:29 PM      Failed - Valid encounter within last 6 months    Recent Outpatient Visits           1 year ago Chronic right shoulder pain   [facility] Tanya Nones, Priscille Heidelberg, MD   1 year ago Need for immunization against influenza   [facility] Donita Brooks, MD   2 years ago Elevated blood sugar   [facility] Tanya Nones, Priscille Heidelberg, MD   3 years ago General medical exam   [facility] Donita Brooks, MD   3 years ago Chest pain, unspecified type   [facility] Pickard, Priscille Heidelberg, MD              Passed - Last BP in normal range    BP Readings from Last 1 Encounters:  08/23/22 124/62         Passed - Last Heart Rate in normal range    Pulse Readings from Last 1 Encounters:  08/23/22 74

## 2022-10-03 ENCOUNTER — Other Ambulatory Visit: Payer: Self-pay | Admitting: Family Medicine

## 2022-10-05 ENCOUNTER — Other Ambulatory Visit: Payer: Self-pay | Admitting: Family Medicine

## 2022-10-07 NOTE — Telephone Encounter (Signed)
Requested Prescriptions  Pending Prescriptions Disp Refills   rosuvastatin (CRESTOR) 20 MG tablet [Pharmacy Med Name: ROSUVASTATIN 20MG  TABLETS] 90 tablet 0    Sig: TAKE 1 TABLET(20 MG) BY MOUTH DAILY     Cardiovascular:  Antilipid - Statins 2 Failed - 10/05/2022  3:35 AM      Failed - Valid encounter within last 12 months    Recent Outpatient Visits           1 year ago Chronic right shoulder pain   [facility] Pickard, Priscille Heidelberg, MD   1 year ago Need for immunization against influenza   [facility] Donita Brooks, MD   2 years ago Elevated blood sugar   [facility] Pickard, Priscille Heidelberg, MD   3 years ago General medical exam   [facility] Donita Brooks, MD   3 years ago Chest pain, unspecified type   [facility] Donita Brooks, MD              Failed - Lipid Panel in normal range within the last 12 months    Cholesterol  Date Value Ref Range Status  08/23/2022 141 <200 mg/dL Final   LDL Cholesterol (Calc)  Date Value Ref Range Status  08/23/2022 67 mg/dL (calc) Final    Comment:    Reference range: <100 . Desirable range <100 mg/dL for primary prevention;   <70 mg/dL for patients with CHD or diabetic patients  with > or = 2 CHD risk factors. Marland Kitchen LDL-C is now calculated using the Martin-Hopkins  calculation, which is a validated novel method providing  better accuracy than the Friedewald equation in the  estimation of LDL-C.  Horald Pollen et al. Lenox Ahr. 7858;850(27): 2061-2068  (http://education.[facility].com/faq/FAQ164)    HDL  Date Value Ref Range Status  08/23/2022 61 > OR = 40 mg/dL Final   Triglycerides  Date Value Ref Range Status  08/23/2022 58 <150 mg/dL Final         Passed - Cr in normal range and within 360 days    Creat  Date Value Ref Range Status  08/23/2022 1.26 0.70 - 1.28 mg/dL Final         Passed - Patient is not pregnant       pantoprazole  (PROTONIX) 40 MG tablet [Pharmacy Med Name: PANTOPRAZOLE 40MG  TABLETS] 90 tablet 0    Sig: TAKE 1 TABLET(40 MG) BY MOUTH DAILY     Gastroenterology: Proton Pump Inhibitors Failed - 10/05/2022  3:35 AM      Failed - Valid encounter within last 12 months    Recent Outpatient Visits           1 year ago Chronic right shoulder pain   [facility] Donita Brooks, MD   1 year ago Need for immunization against influenza   [facility] Donita Brooks, MD   2 years ago Elevated blood sugar   [facility] Pickard, Priscille Heidelberg, MD   3 years ago General medical exam   [facility] Donita Brooks, MD   3 years ago Chest pain, unspecified type   [facility] Pickard, Priscille Heidelberg, MD

## 2022-10-23 ENCOUNTER — Other Ambulatory Visit: Payer: Self-pay | Admitting: Family Medicine

## 2022-10-23 NOTE — Telephone Encounter (Signed)
Due to a computer glitch the protocol is not detecting the last office visit.   It was 08/23/2022.  Requested Prescriptions  Pending Prescriptions Disp Refills   doxazosin (CARDURA) 4 MG tablet [Pharmacy Med Name: DOXAZOSIN 4MG  TABLETS] 30 tablet 0    Sig: TAKE 1 TABLET(4 MG) BY MOUTH DAILY     Cardiovascular:  Alpha Blockers Failed - 10/23/2022  3:36 AM      Failed - Valid encounter within last 6 months    Recent Outpatient Visits           1 year ago Chronic right shoulder pain   [facility] Donita Brooks, MD   1 year ago Need for immunization against influenza   [facility] Donita Brooks, MD   2 years ago Elevated blood sugar   [facility] Tanya Nones, Priscille Heidelberg, MD   3 years ago General medical exam   [facility] Donita Brooks, MD   3 years ago Chest pain, unspecified type   [facility] Pickard, Priscille Heidelberg, MD              Passed - Last BP in normal range    BP Readings from Last 1 Encounters:  08/23/22 124/62          hydrochlorothiazide (HYDRODIURIL) 25 MG tablet [Pharmacy Med Name: HYDROCHLOROTHIAZIDE 25MG  TABLETS] 90 tablet 3    Sig: TAKE 1 TABLET(25 MG) BY MOUTH DAILY     Cardiovascular: Diuretics - Thiazide Failed - 10/23/2022  3:36 AM      Failed - Valid encounter within last 6 months    Recent Outpatient Visits           1 year ago Chronic right shoulder pain   [facility] Donita Brooks, MD   1 year ago Need for immunization against influenza   [facility] Donita Brooks, MD   2 years ago Elevated blood sugar   [facility] Tanya Nones, Priscille Heidelberg, MD   3 years ago General medical exam   [facility] Donita Brooks, MD   3 years ago Chest pain, unspecified type   [facility] Pickard, Priscille Heidelberg, MD              Passed - Cr in normal range and within 180 days    Creat  Date  Value Ref Range Status  08/23/2022 1.26 0.70 - 1.28 mg/dL Final         Passed - K in normal range and within 180 days    Potassium  Date Value Ref Range Status  08/23/2022 4.3 3.5 - 5.3 mmol/L Final         Passed - Na in normal range and within 180 days    Sodium  Date Value Ref Range Status  08/23/2022 140 135 - 146 mmol/L Final         Passed - Last BP in normal range    BP Readings from Last 1 Encounters:  08/23/22 124/62

## 2022-10-30 ENCOUNTER — Other Ambulatory Visit: Payer: Self-pay | Admitting: Physician Assistant

## 2022-11-11 ENCOUNTER — Other Ambulatory Visit: Payer: Self-pay | Admitting: Physician Assistant

## 2022-11-29 ENCOUNTER — Other Ambulatory Visit: Payer: Self-pay | Admitting: Family Medicine

## 2022-11-29 NOTE — Telephone Encounter (Signed)
Requested Prescriptions  Pending Prescriptions Disp Refills   amLODipine (NORVASC) 10 MG tablet [Pharmacy Med Name: AMLODIPINE BESYLATE 10MG  TABLETS] 90 tablet 0    Sig: TAKE 1 TABLET(10 MG) BY MOUTH DAILY     Cardiovascular: Calcium Channel Blockers 2 Failed - 11/29/2022  3:35 AM      Failed - Valid encounter within last 6 months    Recent Outpatient Visits           1 year ago Chronic right shoulder pain   [facility] Pickard, Priscille Heidelberg, MD   1 year ago Need for immunization against influenza   [facility] Donita Brooks, MD   2 years ago Elevated blood sugar   [facility] Tanya Nones, Priscille Heidelberg, MD   3 years ago General medical exam   [facility] Donita Brooks, MD   3 years ago Chest pain, unspecified type   [facility] Pickard, Priscille Heidelberg, MD              Passed - Last BP in normal range    BP Readings from Last 1 Encounters:  08/23/22 124/62         Passed - Last Heart Rate in normal range    Pulse Readings from Last 1 Encounters:  08/23/22 74

## 2022-11-30 ENCOUNTER — Other Ambulatory Visit: Payer: Self-pay | Admitting: Family Medicine

## 2022-12-02 NOTE — Telephone Encounter (Signed)
Requested medication (s) are due for refill today: yes  Requested medication (s) are on the active medication list: yes  Last refill:  10/30/22  Future visit scheduled: no  Notes to clinic:  Unable to refill per protocol, last refill by another provider.  Routing for approval.     Requested Prescriptions  Pending Prescriptions Disp Refills   losartan (COZAAR) 100 MG tablet [Pharmacy Med Name: LOSARTAN 100MG  TABLETS] 90 tablet     Sig: TAKE 1 TABLET(100 MG) BY MOUTH DAILY     There is no refill protocol information for this order

## 2023-01-02 ENCOUNTER — Other Ambulatory Visit: Payer: Self-pay | Admitting: Family Medicine

## 2023-01-19 ENCOUNTER — Other Ambulatory Visit: Payer: Self-pay | Admitting: Family Medicine

## 2023-01-29 ENCOUNTER — Other Ambulatory Visit: Payer: Self-pay | Admitting: Family Medicine

## 2023-01-30 NOTE — Telephone Encounter (Signed)
Last OV 08/23/22 Requested Prescriptions  Pending Prescriptions Disp Refills   hydrochlorothiazide (HYDRODIURIL) 25 MG tablet [Pharmacy Med Name: HYDROCHLOROTHIAZIDE 25MG  TABLETS] 90 tablet 0    Sig: TAKE 1 TABLET(25 MG) BY MOUTH DAILY     Cardiovascular: Diuretics - Thiazide Failed - 01/29/2023 10:42 AM      Failed - Valid encounter within last 6 months    Recent Outpatient Visits           1 year ago Chronic right shoulder pain   [facility] Donita Brooks, MD   1 year ago Need for immunization against influenza   [facility] Donita Brooks, MD   2 years ago Elevated blood sugar   [facility] Tanya Nones, Priscille Heidelberg, MD   3 years ago General medical exam   [facility] Donita Brooks, MD   4 years ago Chest pain, unspecified type   [facility] Pickard, Priscille Heidelberg, MD              Passed - Cr in normal range and within 180 days    Creat  Date Value Ref Range Status  08/23/2022 1.26 0.70 - 1.28 mg/dL Final         Passed - K in normal range and within 180 days    Potassium  Date Value Ref Range Status  08/23/2022 4.3 3.5 - 5.3 mmol/L Final         Passed - Na in normal range and within 180 days    Sodium  Date Value Ref Range Status  08/23/2022 140 135 - 146 mmol/L Final         Passed - Last BP in normal range    BP Readings from Last 1 Encounters:  08/23/22 124/62

## 2023-02-03 ENCOUNTER — Other Ambulatory Visit: Payer: Self-pay | Admitting: Family Medicine

## 2023-02-04 NOTE — Telephone Encounter (Signed)
Requested Prescriptions  Pending Prescriptions Disp Refills   doxazosin (CARDURA) 4 MG tablet [Pharmacy Med Name: DOXAZOSIN 4MG  TABLETS] 90 tablet 0    Sig: TAKE 1 TABLET(4 MG) BY MOUTH DAILY     Cardiovascular:  Alpha Blockers Failed - 02/03/2023  9:05 AM      Failed - Valid encounter within last 6 months    Recent Outpatient Visits           1 year ago Chronic right shoulder pain   [facility] Tanya Nones, Priscille Heidelberg, MD   1 year ago Need for immunization against influenza   [facility] Donita Brooks, MD   2 years ago Elevated blood sugar   [facility] Tanya Nones, Priscille Heidelberg, MD   3 years ago General medical exam   [facility] Donita Brooks, MD   4 years ago Chest pain, unspecified type   [facility] Pickard, Priscille Heidelberg, MD              Passed - Last BP in normal range    BP Readings from Last 1 Encounters:  08/23/22 124/62

## 2023-02-25 ENCOUNTER — Other Ambulatory Visit: Payer: Self-pay | Admitting: Family Medicine

## 2023-02-26 NOTE — Telephone Encounter (Signed)
Requested Prescriptions  Pending Prescriptions Disp Refills   amLODipine (NORVASC) 10 MG tablet [Pharmacy Med Name: AMLODIPINE BESYLATE 10MG  TABLETS] 90 tablet 0    Sig: TAKE 1 TABLET(10 MG) BY MOUTH DAILY     Cardiovascular: Calcium Channel Blockers 2 Failed - 02/25/2023  3:36 AM      Failed - Valid encounter within last 6 months    Recent Outpatient Visits           1 year ago Chronic right shoulder pain   [facility] Tanya Nones, Priscille Heidelberg, MD   1 year ago Need for immunization against influenza   [facility] Donita Brooks, MD   3 years ago Elevated blood sugar   [facility] Tanya Nones, Priscille Heidelberg, MD   4 years ago General medical exam   [facility] Donita Brooks, MD   4 years ago Chest pain, unspecified type   [facility] Pickard, Priscille Heidelberg, MD              Passed - Last BP in normal range    BP Readings from Last 1 Encounters:  08/23/22 124/62         Passed - Last Heart Rate in normal range    Pulse Readings from Last 1 Encounters:  08/23/22 74

## 2023-04-01 ENCOUNTER — Other Ambulatory Visit: Payer: Self-pay | Admitting: Family Medicine

## 2023-04-01 NOTE — Telephone Encounter (Signed)
Requested medication (s) are due for refill today: Yes  Requested medication (s) are on the active medication list: Yes  Last refill:    Future visit scheduled: No  Notes to clinic:  Protocol indicates OV needed.    Requested Prescriptions  Pending Prescriptions Disp Refills   rosuvastatin (CRESTOR) 20 MG tablet [Pharmacy Med Name: ROSUVASTATIN 20MG  TABLETS] 90 tablet 0    Sig: TAKE 1 TABLET(20 MG) BY MOUTH DAILY     Cardiovascular:  Antilipid - Statins 2 Failed - 04/01/2023  3:36 AM      Failed - Valid encounter within last 12 months    Recent Outpatient Visits           1 year ago Chronic right shoulder pain   [facility] Tanya Nones, Priscille Heidelberg, MD   2 years ago Need for immunization against influenza   [facility] Donita Brooks, MD   3 years ago Elevated blood sugar   [facility] Pickard, Priscille Heidelberg, MD   4 years ago General medical exam   [facility] Donita Brooks, MD   4 years ago Chest pain, unspecified type   [facility] Donita Brooks, MD              Failed - Lipid Panel in normal range within the last 12 months    Cholesterol  Date Value Ref Range Status  08/23/2022 141 <200 mg/dL Final   LDL Cholesterol (Calc)  Date Value Ref Range Status  08/23/2022 67 mg/dL (calc) Final    Comment:    Reference range: <100 . Desirable range <100 mg/dL for primary prevention;   <70 mg/dL for patients with CHD or diabetic patients  with > or = 2 CHD risk factors. Marland Kitchen LDL-C is now calculated using the Martin-Hopkins  calculation, which is a validated novel method providing  better accuracy than the Friedewald equation in the  estimation of LDL-C.  Horald Pollen et al. Lenox Ahr. 1610;960(45): 2061-2068  (http://education.[facility].com/faq/FAQ164)    HDL  Date Value Ref Range Status  08/23/2022 61 > OR = 40 mg/dL Final   Triglycerides  Date Value Ref Range Status  08/23/2022  58 <150 mg/dL Final         Passed - Cr in normal range and within 360 days    Creat  Date Value Ref Range Status  08/23/2022 1.26 0.70 - 1.28 mg/dL Final         Passed - Patient is not pregnant       pantoprazole (PROTONIX) 40 MG tablet [Pharmacy Med Name: PANTOPRAZOLE 40MG  TABLETS] 90 tablet 0    Sig: TAKE 1 TABLET(40 MG) BY MOUTH DAILY     Gastroenterology: Proton Pump Inhibitors Failed - 04/01/2023  3:36 AM      Failed - Valid encounter within last 12 months    Recent Outpatient Visits           1 year ago Chronic right shoulder pain   [facility] Donita Brooks, MD   2 years ago Need for immunization against influenza   [facility] Donita Brooks, MD   3 years ago Elevated blood sugar   [facility] Pickard, Priscille Heidelberg, MD   4 years ago General medical exam   [facility] Donita Brooks, MD   4 years ago Chest pain, unspecified type   [facility] Medicine Pickard, Priscille Heidelberg, MD

## 2023-04-21 ENCOUNTER — Other Ambulatory Visit: Payer: Self-pay | Admitting: Family Medicine

## 2023-04-22 NOTE — Telephone Encounter (Signed)
Requested medication (s) are due for refill today:   Given #30, 0 refills yesterday 10/28  Requested medication (s) are on the active medication list:   Yes  Future visit scheduled:   No    Last ordered: 04/21/2023 #30, 0 refills  Unable to refill because labs are due.   Requested Prescriptions  Pending Prescriptions Disp Refills   hydrochlorothiazide (HYDRODIURIL) 25 MG tablet [Pharmacy Med Name: HYDROCHLOROTHIAZIDE 25MG  TABLETS] 90 tablet     Sig: TAKE 1 TABLET(25 MG) BY MOUTH DAILY     Cardiovascular: Diuretics - Thiazide Failed - 04/21/2023  5:19 PM      Failed - Cr in normal range and within 180 days    Creat  Date Value Ref Range Status  08/23/2022 1.26 0.70 - 1.28 mg/dL Final         Failed - K in normal range and within 180 days    Potassium  Date Value Ref Range Status  08/23/2022 4.3 3.5 - 5.3 mmol/L Final         Failed - Na in normal range and within 180 days    Sodium  Date Value Ref Range Status  08/23/2022 140 135 - 146 mmol/L Final         Failed - Valid encounter within last 6 months    Recent Outpatient Visits           1 year ago Chronic right shoulder pain   [facility] Donita Brooks, MD   2 years ago Need for immunization against influenza   [facility] Donita Brooks, MD   3 years ago Elevated blood sugar   [facility] Tanya Nones, Priscille Heidelberg, MD   4 years ago General medical exam   [facility] Donita Brooks, MD   4 years ago Chest pain, unspecified type   [facility] Medicine Pickard, Priscille Heidelberg, MD              Passed - Last BP in normal range    BP Readings from Last 1 Encounters:  08/23/22 124/62

## 2023-04-28 ENCOUNTER — Other Ambulatory Visit: Payer: Self-pay | Admitting: Family Medicine

## 2023-04-29 NOTE — Telephone Encounter (Signed)
Requested by interface surescripts. Courtesy refill. Last OV 08/23/22. Needs OV.  Requested Prescriptions  Pending Prescriptions Disp Refills   doxazosin (CARDURA) 4 MG tablet [Pharmacy Med Name: DOXAZOSIN 4MG  TABLETS] 90 tablet 0    Sig: TAKE 1 TABLET(4 MG) BY MOUTH DAILY     Cardiovascular:  Alpha Blockers Failed - 04/29/2023  3:01 PM      Failed - Valid encounter within last 6 months    Recent Outpatient Visits           1 year ago Chronic right shoulder pain   [facility] Donita Brooks, MD   2 years ago Need for immunization against influenza   [facility] Donita [facility], MD   3 years ago Elevated blood sugar   [facility] Tanya Nones, Priscille Heidelberg, MD   4 years ago General medical exam   [facility] Donita Brooks, MD   4 years ago Chest pain, unspecified type   [facility] Medicine Pickard, Priscille Heidelberg, MD              Passed - Last BP in normal range    BP Readings from Last 1 Encounters:  08/23/22 124/62

## 2023-05-20 ENCOUNTER — Other Ambulatory Visit: Payer: Self-pay | Admitting: Family Medicine

## 2023-05-20 NOTE — Telephone Encounter (Signed)
Requested medications are due for refill today.  yes  Requested medications are on the active medications list.  yes  Last refill. 04/21/2023 #30 0 rf  Future visit scheduled.   no  Notes to clinic.  Pt last seen 08/23/2022. Please advise.    Requested Prescriptions  Pending Prescriptions Disp Refills   hydrochlorothiazide (HYDRODIURIL) 25 MG tablet [Pharmacy Med Name: HYDROCHLOROTHIAZIDE 25MG  TABLETS] 90 tablet     Sig: TAKE 1 TABLET(25 MG) BY MOUTH DAILY     Cardiovascular: Diuretics - Thiazide Failed - 05/20/2023  3:37 AM      Failed - Cr in normal range and within 180 days    Creat  Date Value Ref Range Status  08/23/2022 1.26 0.70 - 1.28 mg/dL Final         Failed - K in normal range and within 180 days    Potassium  Date Value Ref Range Status  08/23/2022 4.3 3.5 - 5.3 mmol/L Final         Failed - Na in normal range and within 180 days    Sodium  Date Value Ref Range Status  08/23/2022 140 135 - 146 mmol/L Final         Failed - Valid encounter within last 6 months    Recent Outpatient Visits           1 year ago Chronic right shoulder pain   [facility] Donita Brooks, MD   2 years ago Need for immunization against influenza   [facility] Donita Brooks, MD   3 years ago Elevated blood sugar   [facility] Pickard, Priscille Heidelberg, MD   4 years ago General medical exam   [facility] Donita Brooks, MD   4 years ago Chest pain, unspecified type   [facility] Pickard, Priscille Heidelberg, MD              Passed - Last BP in normal range    BP Readings from Last 1 Encounters:  08/23/22 124/62         Signed Prescriptions Disp Refills   pantoprazole (PROTONIX) 40 MG tablet 90 tablet 0    Sig: TAKE 1 TABLET(40 MG) BY MOUTH DAILY     Gastroenterology: Proton Pump Inhibitors Failed - 05/20/2023  3:37 AM      Failed - Valid encounter within last 12 months    Recent Outpatient  Visits           1 year ago Chronic right shoulder pain   [facility] Tanya Nones, Priscille Heidelberg, MD   2 years ago Need for immunization against influenza   [facility] Donita Brooks, MD   3 years ago Elevated blood sugar   [facility] Pickard, Priscille Heidelberg, MD   4 years ago General medical exam   [facility] Donita Brooks, MD   4 years ago Chest pain, unspecified type   [facility] Medicine Pickard, Priscille Heidelberg, MD               rosuvastatin (CRESTOR) 20 MG tablet 90 tablet 0    Sig: TAKE 1 TABLET(20 MG) BY MOUTH DAILY     Cardiovascular:  Antilipid - Statins 2 Failed - 05/20/2023  3:37 AM      Failed - Valid encounter within last 12 months    Recent Outpatient Visits  1 year ago Chronic right shoulder pain   [facility] Pickard, Priscille Heidelberg, MD   2 years ago Need for immunization against influenza   [facility] Tanya Nones, Priscille Heidelberg, MD   3 years ago Elevated blood sugar   [facility] Medicine Tanya Nones, Priscille Heidelberg, MD   4 years ago General medical exam   [facility] Donita Brooks, MD   4 years ago Chest pain, unspecified type   [facility] Donita Brooks, MD              Failed - Lipid Panel in normal range within the last 12 months    Cholesterol  Date Value Ref Range Status  08/23/2022 141 <200 mg/dL Final   LDL Cholesterol (Calc)  Date Value Ref Range Status  08/23/2022 67 mg/dL (calc) Final    Comment:    Reference range: <100 . Desirable range <100 mg/dL for primary prevention;   <70 mg/dL for patients with CHD or diabetic patients  with > or = 2 CHD risk factors. Marland Kitchen LDL-C is now calculated using the Martin-Hopkins  calculation, which is a validated novel method providing  better accuracy than the Friedewald equation in the  estimation of LDL-C.  Horald Pollen et al. Lenox Ahr. 7829;562(13): 2061-2068   (http://education.[facility].com/faq/FAQ164)    HDL  Date Value Ref Range Status  08/23/2022 61 > OR = 40 mg/dL Final   Triglycerides  Date Value Ref Range Status  08/23/2022 58 <150 mg/dL Final         Passed - Cr in normal range and within 360 days    Creat  Date Value Ref Range Status  08/23/2022 1.26 0.70 - 1.28 mg/dL Final         Passed - Patient is not pregnant

## 2023-05-20 NOTE — Telephone Encounter (Signed)
Requested Prescriptions  Pending Prescriptions Disp Refills   pantoprazole (PROTONIX) 40 MG tablet [Pharmacy Med Name: PANTOPRAZOLE 40MG  TABLETS] 90 tablet 0    Sig: TAKE 1 TABLET(40 MG) BY MOUTH DAILY     Gastroenterology: Proton Pump Inhibitors Failed - 05/20/2023  3:37 AM      Failed - Valid encounter within last 12 months    Recent Outpatient Visits           1 year ago Chronic right shoulder pain   [facility] Tanya Nones, Priscille Heidelberg, MD   2 years ago Need for immunization against influenza   [facility] Donita Brooks, MD   3 years ago Elevated blood sugar   [facility] Pickard, Priscille Heidelberg, MD   4 years ago General medical exam   [facility] Donita Brooks, MD   4 years ago Chest pain, unspecified type   [facility] Medicine Pickard, Priscille Heidelberg, MD               rosuvastatin (CRESTOR) 20 MG tablet [Pharmacy Med Name: ROSUVASTATIN 20MG  TABLETS] 90 tablet 0    Sig: TAKE 1 TABLET(20 MG) BY MOUTH DAILY     Cardiovascular:  Antilipid - Statins 2 Failed - 05/20/2023  3:37 AM      Failed - Valid encounter within last 12 months    Recent Outpatient Visits           1 year ago Chronic right shoulder pain   [facility] Tanya Nones, Priscille Heidelberg, MD   2 years ago Need for immunization against influenza   [facility] Donita Brooks, MD   3 years ago Elevated blood sugar   [facility] Medicine Pickard, Priscille Heidelberg, MD   4 years ago General medical exam   [facility] Donita Brooks, MD   4 years ago Chest pain, unspecified type   [facility] Donita Brooks, MD              Failed - Lipid Panel in normal range within the last 12 months    Cholesterol  Date Value Ref Range Status  08/23/2022 141 <200 mg/dL Final   LDL Cholesterol (Calc)  Date Value Ref Range Status  08/23/2022 67 mg/dL (calc) Final    Comment:     Reference range: <100 . Desirable range <100 mg/dL for primary prevention;   <70 mg/dL for patients with CHD or diabetic patients  with > or = 2 CHD risk factors. Marland Kitchen LDL-C is now calculated using the Martin-Hopkins  calculation, which is a validated novel method providing  better accuracy than the Friedewald equation in the  estimation of LDL-C.  Horald Pollen et al. Lenox Ahr. 1610;960(45): 2061-2068  (http://education.[facility].com/faq/FAQ164)    HDL  Date Value Ref Range Status  08/23/2022 61 > OR = 40 mg/dL Final   Triglycerides  Date Value Ref Range Status  08/23/2022 58 <150 mg/dL Final         Passed - Cr in normal range and within 360 days    Creat  Date Value Ref Range Status  08/23/2022 1.26 0.70 - 1.28 mg/dL Final         Passed - Patient is not pregnant       hydrochlorothiazide (HYDRODIURIL) 25 MG tablet [Pharmacy Med Name: HYDROCHLOROTHIAZIDE 25MG  TABLETS] 90 tablet     Sig: TAKE 1 TABLET(25 MG) BY MOUTH DAILY  Cardiovascular: Diuretics - Thiazide Failed - 05/20/2023  3:37 AM      Failed - Cr in normal range and within 180 days    Creat  Date Value Ref Range Status  08/23/2022 1.26 0.70 - 1.28 mg/dL Final         Failed - K in normal range and within 180 days    Potassium  Date Value Ref Range Status  08/23/2022 4.3 3.5 - 5.3 mmol/L Final         Failed - Na in normal range and within 180 days    Sodium  Date Value Ref Range Status  08/23/2022 140 135 - 146 mmol/L Final         Failed - Valid encounter within last 6 months    Recent Outpatient Visits           1 year ago Chronic right shoulder pain   [facility] Donita Brooks, MD   2 years ago Need for immunization against influenza   [facility] Donita Brooks, MD   3 years ago Elevated blood sugar   [facility] Medicine Tanya Nones, Priscille Heidelberg, MD   4 years ago General medical exam   [facility] Donita Brooks, MD   4  years ago Chest pain, unspecified type   [facility] Medicine Pickard, Priscille Heidelberg, MD              Passed - Last BP in normal range    BP Readings from Last 1 Encounters:  08/23/22 124/62

## 2023-05-25 ENCOUNTER — Other Ambulatory Visit: Payer: Self-pay | Admitting: Family Medicine

## 2023-05-27 ENCOUNTER — Other Ambulatory Visit: Payer: Self-pay

## 2023-05-27 ENCOUNTER — Other Ambulatory Visit: Payer: Self-pay | Admitting: Family Medicine

## 2023-05-27 MED ORDER — HYDROCHLOROTHIAZIDE 25 MG PO TABS: ORAL_TABLET | ORAL | 0 refills | Status: DC

## 2023-05-28 ENCOUNTER — Other Ambulatory Visit: Payer: Self-pay | Admitting: Family Medicine

## 2023-05-28 NOTE — Telephone Encounter (Signed)
Requested medication (s) are due for refill today: Yes  Requested medication (s) are on the active medication list: Yes  Last refill:  02/26/23  Future visit scheduled: No  Notes to clinic:  Protocol indicates pt. Needs OV.    Requested Prescriptions  Pending Prescriptions Disp Refills   amLODipine (NORVASC) 10 MG tablet [Pharmacy Med Name: AMLODIPINE BESYLATE 10MG  TABLETS] 90 tablet 0    Sig: TAKE 1 TABLET(10 MG) BY MOUTH DAILY     Cardiovascular: Calcium Channel Blockers 2 Failed - 05/25/2023  3:35 AM      Failed - Valid encounter within last 6 months    Recent Outpatient Visits           1 year ago Chronic right shoulder pain   [facility] Tanya Nones, Priscille Heidelberg, MD   2 years ago Need for immunization against influenza   [facility] Donita Brooks, MD   3 years ago Elevated blood sugar   [facility] Medicine Tanya Nones, Priscille Heidelberg, MD   4 years ago General medical exam   [facility] Medicine Donita Brooks, MD   4 years ago Chest pain, unspecified type   [facility] Medicine Pickard, Priscille Heidelberg, MD              Passed - Last BP in normal range    BP Readings from Last 1 Encounters:  08/23/22 124/62         Passed - Last Heart Rate in normal range    Pulse Readings from Last 1 Encounters:  08/23/22 74

## 2023-05-29 NOTE — Telephone Encounter (Signed)
OV 08/23/22 Requested Prescriptions  Pending Prescriptions Disp Refills   hydrochlorothiazide (HYDRODIURIL) 25 MG tablet [Pharmacy Med Name: HYDROCHLOROTHIAZIDE 25MG  TABLETS] 90 tablet     Sig: TAKE 1 TABLET(25 MG) BY MOUTH DAILY     Cardiovascular: Diuretics - Thiazide Failed - 05/27/2023  5:51 PM      Failed - Cr in normal range and within 180 days    Creat  Date Value Ref Range Status  08/23/2022 1.26 0.70 - 1.28 mg/dL Final         Failed - K in normal range and within 180 days    Potassium  Date Value Ref Range Status  08/23/2022 4.3 3.5 - 5.3 mmol/L Final         Failed - Na in normal range and within 180 days    Sodium  Date Value Ref Range Status  08/23/2022 140 135 - 146 mmol/L Final         Failed - Valid encounter within last 6 months    Recent Outpatient Visits           1 year ago Chronic right shoulder pain   [facility] Donita Brooks, MD   2 years ago Need for immunization against influenza   [facility] Medicine Donita Brooks, MD   3 years ago Elevated blood sugar   [facility] Medicine Tanya Nones, Priscille Heidelberg, MD   4 years ago General medical exam   [facility] Medicine Donita Brooks, MD   4 years ago Chest pain, unspecified type   [facility] Medicine Pickard, Priscille Heidelberg, MD              Passed - Last BP in normal range    BP Readings from Last 1 Encounters:  08/23/22 124/62

## 2023-05-29 NOTE — Telephone Encounter (Signed)
Requested medications are due for refill today.  yes  Requested medications are on the active medications list.  yes  Last refill. 12/04/2022 #90 1 rf  Future visit scheduled.   no  Notes to clinic.  Pt is more than 3 months overdue for an ov. Pt last seen 08/2022.    Requested Prescriptions  Pending Prescriptions Disp Refills   losartan (COZAAR) 100 MG tablet [Pharmacy Med Name: LOSARTAN 100MG  TABLETS] 90 tablet 1    Sig: TAKE 1 TABLET(100 MG) BY MOUTH DAILY     Cardiovascular:  Angiotensin Receptor Blockers Failed - 05/29/2023  5:09 PM      Failed - Cr in normal range and within 180 days    Creat  Date Value Ref Range Status  08/23/2022 1.26 0.70 - 1.28 mg/dL Final         Failed - K in normal range and within 180 days    Potassium  Date Value Ref Range Status  08/23/2022 4.3 3.5 - 5.3 mmol/L Final         Failed - Valid encounter within last 6 months    Recent Outpatient Visits           1 year ago Chronic right shoulder pain   [facility] Donita Brooks, MD   2 years ago Need for immunization against influenza   [facility] Donita Brooks, MD   3 years ago Elevated blood sugar   [facility] Medicine Tanya Nones, Priscille Heidelberg, MD   4 years ago General medical exam   [facility] Medicine Donita Brooks, MD   4 years ago Chest pain, unspecified type   [facility] Medicine Pickard, Priscille Heidelberg, MD              Passed - Patient is not pregnant      Passed - Last BP in normal range    BP Readings from Last 1 Encounters:  08/23/22 124/62

## 2023-06-03 ENCOUNTER — Telehealth: Payer: Self-pay | Admitting: Family Medicine

## 2023-06-03 DIAGNOSIS — Z0279 Encounter for issue of other medical certificate: Secondary | ICD-10-CM

## 2023-06-03 NOTE — Telephone Encounter (Signed)
Patient dropped off form for provider to complete and sign on 05/27/23; needed for his current employer.   Paperwork was placed on nurse's desk, then placed in provider's green folder for completion.  Completed paperwork returned to the front desk 06/03/23; patient still needs a TB test.  Left message to return call on patient's home answering machine.  Patient advised in advance of a form completion fee of $10 which he should pay after paperwork completed.

## 2023-06-05 ENCOUNTER — Other Ambulatory Visit (INDEPENDENT_AMBULATORY_CARE_PROVIDER_SITE_OTHER): Payer: Medicare Other

## 2023-06-05 DIAGNOSIS — Z111 Encounter for screening for respiratory tuberculosis: Secondary | ICD-10-CM

## 2023-06-05 NOTE — Telephone Encounter (Signed)
Patient paid form completion fee of $10 and took blood TB test today. Gave him the original paperwork. Patient is requesting for TB results to be sent to him via email at "[email or web address]".  Completed form, copy of receipt, and intake forms successfully faxed to [facility] with a confirmation time stamp of 06-05-2023 10:28

## 2023-06-08 ENCOUNTER — Other Ambulatory Visit: Payer: Self-pay | Admitting: Family Medicine

## 2023-06-08 LAB — QUANTIFERON-TB GOLD PLUS
Mitogen-NIL: >10 [IU]/mL
NIL: 0.1 [IU]/mL
QuantiFERON-TB Gold Plus: NEGATIVE
TB1-NIL: 0.01 [IU]/mL
TB2-NIL: 0.04 [IU]/mL

## 2023-06-09 ENCOUNTER — Telehealth: Payer: Self-pay

## 2023-06-09 NOTE — Telephone Encounter (Signed)
Informed pt of results, he is needing them sent to his email listed below

## 2023-06-21 ENCOUNTER — Other Ambulatory Visit: Payer: Self-pay | Admitting: Family Medicine

## 2023-06-22 ENCOUNTER — Other Ambulatory Visit: Payer: Self-pay | Admitting: Family Medicine

## 2023-06-24 ENCOUNTER — Telehealth: Payer: Self-pay

## 2023-06-24 ENCOUNTER — Other Ambulatory Visit: Payer: Self-pay | Admitting: Family Medicine

## 2023-06-24 MED ORDER — AMLODIPINE BESYLATE 10 MG PO TABS: 10.0000 mg | ORAL_TABLET | Freq: Every day | ORAL | 1 refills | Status: DC

## 2023-06-24 NOTE — Telephone Encounter (Signed)
 Copied from CRM [id number]. Topic: Clinical - Medication Refill >> Jun 24, 2023  2:55 PM Elle L wrote: Most Recent Primary Care Visit:  Provider: [facility]  Department[facility]  Visit Type: LAB VISIT  Date: 06/05/2023  Medication: amLODipine  (NORVASC ) 10 MG tablet   Has the patient contacted their pharmacy? Yes, they state that refill was denied and to contact their provider.  Is this the correct pharmacy for this prescription? Yes  This is the patient's preferred pharmacy:  [id number] - [place], [place] - [address] [place] [address] Phone: [phone] Fax: [phone]   Has the prescription been filled recently? Yes  Is the patient out of the medication? Yes  Has the patient been seen for an appointment in the last year OR does the patient have an upcoming appointment? Yes  Can we respond through MyChart? No  Agent: Please be advised that Rx refills may take up to 3 business days. We ask that you follow-up with your pharmacy.

## 2023-06-24 NOTE — Telephone Encounter (Signed)
 Copied from CRM [id number]. Topic: Clinical - Medication Refill >> Jun 24, 2023  2:55 PM Elle L wrote: Most Recent Primary Care Visit:  Provider: [facility]  Department: [facility]  Visit Type: LAB VISIT  Date: 06/05/2023  Medication: ***  Has the patient contacted their pharmacy?  (Agent: If no, request that the patient contact the pharmacy for the refill. If patient does not wish to contact the pharmacy document the reason why and proceed with request.) (Agent: If yes, when and what did the pharmacy advise?)  Is this the correct pharmacy for this prescription?  If no, delete pharmacy and type the correct one.  This is the patient's preferred pharmacy:  [id number] - [place], [place] - [address] [place] [address] Phone: [phone] Fax: [phone]  [facility] - [place], [place] - [address] [place] [address] Phone: [phone] Fax: [phone]   Has the prescription been filled recently?   Is the patient out of the medication?   Has the patient been seen for an appointment in the last year OR does the patient have an upcoming appointment?   Can we respond through MyChart?   Agent: Please be advised that Rx refills may take up to 3 business days. We ask that you follow-up with your pharmacy.

## 2023-07-26 ENCOUNTER — Other Ambulatory Visit: Payer: Self-pay | Admitting: Family Medicine

## 2023-07-28 NOTE — Telephone Encounter (Signed)
Pharmacy sent 2nd refill request for doxazosin (CARDURA) 4 MG tablet   LOV: 08/23/2022 (cpe)  Pharmacy:  [id number] - [place], [place] - [address] AT [address], [place] [address] Phone: [phone]  Fax: [phone] DEA #: [id number]   Please advise pharmacist.

## 2023-07-28 NOTE — Telephone Encounter (Signed)
Requested medication (s) are due for refill today: yes   Requested medication (s) are on the active medication list: yes   Last refill:  04/29/23 #90 0 refills  Future visit scheduled: no   Notes to clinic:  last OV 08/23/22. No refills remain. Do you want to refill Rx? Patient needs to be called to schedule CPE after 08/24/23.     Requested Prescriptions  Pending Prescriptions Disp Refills   doxazosin (CARDURA) 4 MG tablet [Pharmacy Med Name: DOXAZOSIN 4MG  TABLETS] 90 tablet 0    Sig: TAKE 1 TABLET(4 MG) BY MOUTH DAILY     Cardiovascular:  Alpha Blockers Failed - 07/28/2023 10:20 AM      Failed - Valid encounter within last 6 months    Recent Outpatient Visits           2 years ago Chronic right shoulder pain   [facility] Tanya Nones, Priscille Heidelberg, MD   2 years ago Need for immunization against influenza   [facility] Donita Brooks, MD   3 years ago Elevated blood sugar   [facility] Tanya Nones, Priscille Heidelberg, MD   4 years ago General medical exam   [facility] Donita Brooks, MD   4 years ago Chest pain, unspecified type   [facility] Pickard, Priscille Heidelberg, MD              Passed - Last BP in normal range    BP Readings from Last 1 Encounters:  08/23/22 124/62

## 2023-07-29 ENCOUNTER — Inpatient Hospital Stay (HOSPITAL_COMMUNITY)
Admission: EM | Admit: 2023-07-29 | Discharge: 2023-07-31 | DRG: 389 | Disposition: A | Discharge status: Home / Self Care | Payer: Medicare Other | Attending: Internal Medicine | Admitting: Internal Medicine

## 2023-07-29 ENCOUNTER — Other Ambulatory Visit: Payer: Self-pay

## 2023-07-29 ENCOUNTER — Encounter (HOSPITAL_COMMUNITY): Payer: Self-pay | Admitting: Emergency Medicine

## 2023-07-29 ENCOUNTER — Emergency Department (HOSPITAL_COMMUNITY): Payer: Medicare Other

## 2023-07-29 DIAGNOSIS — E66811 Obesity, class 1: Secondary | ICD-10-CM | POA: Diagnosis present

## 2023-07-29 DIAGNOSIS — I10 Essential (primary) hypertension: Secondary | ICD-10-CM | POA: Diagnosis not present

## 2023-07-29 DIAGNOSIS — T476X5A Adverse effect of antidiarrheal drugs, initial encounter: Secondary | ICD-10-CM | POA: Diagnosis present

## 2023-07-29 DIAGNOSIS — Z8673 Personal history of transient ischemic attack (TIA), and cerebral infarction without residual deficits: Secondary | ICD-10-CM

## 2023-07-29 DIAGNOSIS — N390 Urinary tract infection, site not specified: Secondary | ICD-10-CM | POA: Diagnosis present

## 2023-07-29 DIAGNOSIS — Z79899 Other long term (current) drug therapy: Secondary | ICD-10-CM | POA: Diagnosis not present

## 2023-07-29 DIAGNOSIS — Z87891 Personal history of nicotine dependence: Secondary | ICD-10-CM | POA: Diagnosis not present

## 2023-07-29 DIAGNOSIS — K219 Gastro-esophageal reflux disease without esophagitis: Secondary | ICD-10-CM | POA: Diagnosis present

## 2023-07-29 DIAGNOSIS — Z6830 Body mass index (BMI) 30.0-30.9, adult: Secondary | ICD-10-CM

## 2023-07-29 DIAGNOSIS — E785 Hyperlipidemia, unspecified: Secondary | ICD-10-CM | POA: Diagnosis not present

## 2023-07-29 DIAGNOSIS — Z880 Allergy status to penicillin: Secondary | ICD-10-CM | POA: Diagnosis not present

## 2023-07-29 DIAGNOSIS — Z7982 Long term (current) use of aspirin: Secondary | ICD-10-CM

## 2023-07-29 DIAGNOSIS — K56609 Unspecified intestinal obstruction, unspecified as to partial versus complete obstruction: Principal | ICD-10-CM | POA: Diagnosis present

## 2023-07-29 DIAGNOSIS — R1032 Left lower quadrant pain: Secondary | ICD-10-CM | POA: Diagnosis not present

## 2023-07-29 DIAGNOSIS — K5669 Other partial intestinal obstruction: Secondary | ICD-10-CM | POA: Diagnosis not present

## 2023-07-29 DIAGNOSIS — Z888 Allergy status to other drugs, medicaments and biological substances status: Secondary | ICD-10-CM | POA: Diagnosis not present

## 2023-07-29 DIAGNOSIS — N400 Enlarged prostate without lower urinary tract symptoms: Secondary | ICD-10-CM | POA: Diagnosis present

## 2023-07-29 LAB — COMPREHENSIVE METABOLIC PANEL
ALT: 30 U/L (ref 0–44)
AST: 24 U/L (ref 15–41)
Albumin: 3.9 g/dL (ref 3.5–5.0)
Alkaline Phosphatase: 62 U/L (ref 38–126)
Anion gap: 11 (ref 5–15)
BUN: 28 mg/dL — ABNORMAL HIGH (ref 8–23)
CO2: 30 mmol/L (ref 22–32)
Calcium: 9.4 mg/dL (ref 8.9–10.3)
Chloride: 97 mmol/L — ABNORMAL LOW (ref 98–111)
Creatinine, Ser: 1.23 mg/dL (ref 0.61–1.24)
GFR, Estimated: >60 mL/min (ref >60)
Glucose, Bld: 147 mg/dL — ABNORMAL HIGH (ref 70–99)
Potassium: 3.5 mmol/L (ref 3.5–5.1)
Sodium: 138 mmol/L (ref 135–145)
Total Bilirubin: 1.1 mg/dL (ref 0.0–1.2)
Total Protein: 7 g/dL (ref 6.5–8.1)

## 2023-07-29 LAB — URINALYSIS, ROUTINE W REFLEX MICROSCOPIC
Bilirubin Urine: NEGATIVE
Glucose, UA: NEGATIVE mg/dL
Ketones, ur: 5 mg/dL — AB
Leukocytes,Ua: NEGATIVE
Nitrite: NEGATIVE
Protein, ur: NEGATIVE mg/dL
Specific Gravity, Urine: 1.019 (ref 1.005–1.030)
pH: 5 (ref 5.0–8.0)

## 2023-07-29 LAB — CBC
HCT: 45.5 % (ref 39.0–52.0)
Hemoglobin: 15.7 g/dL (ref 13.0–17.0)
MCH: 30.1 pg (ref 26.0–34.0)
MCHC: 34.5 g/dL (ref 30.0–36.0)
MCV: 87.3 fL (ref 80.0–100.0)
Platelets: 271 10*3/uL (ref 150–400)
RBC: 5.21 MIL/uL (ref 4.22–5.81)
RDW: 12.7 % (ref 11.5–15.5)
WBC: 7.3 10*3/uL (ref 4.0–10.5)
nRBC: 0 % (ref 0.0–0.2)

## 2023-07-29 LAB — LIPASE, BLOOD: Lipase: 33 U/L (ref 11–51)

## 2023-07-29 LAB — RESP PANEL BY RT-PCR (RSV, FLU A&B, COVID)  RVPGX2
Influenza A by PCR: NEGATIVE
Influenza B by PCR: NEGATIVE
Resp Syncytial Virus by PCR: NEGATIVE
SARS Coronavirus 2 by RT PCR: NEGATIVE

## 2023-07-29 MED ORDER — IOHEXOL 300 MG/ML  SOLN
100.0000 mL | Freq: Once | INTRAMUSCULAR | Status: AC | PRN
Start: 2023-07-29 — End: 2023-07-29
  Administered 2023-07-29: 100 mL via INTRAVENOUS

## 2023-07-29 NOTE — ED Provider Notes (Signed)
 [place] EMERGENCY DEPARTMENT AT [facility] Provider Note   CSN: [id number] Arrival date & time: 07/29/23  1743     History  Chief Complaint  Patient presents with   Abdominal Pain    Joseph Dillon is a 78 y.o. male.   Abdominal Pain  78 year old male presenting to the hospital with a complaint of abdominal pain, it is lower abdominal pain, specifically left lower abdominal pain and has been going on for the better part of the last few days.  He states that he is not having any fevers or chills, he has not been able to have a bowel movement in the better part of the week, he has no urinary symptoms.  I have reviewed the medical record and the patient's last colonoscopy was in 2022, he had nonbleeding external hemorrhoids, scattered medium mouth diverticula and diverticulosis of the entire examined colon.    Home Medications Prior to Admission medications   Medication Sig Start Date End Date Taking? Authorizing Provider  amLODipine  (NORVASC ) 10 MG tablet Take 1 tablet (10 mg total) by mouth daily. 06/24/23   Duanne Butler [name], MD  aspirin EC 81 MG tablet Take 81 mg by mouth daily.    [provider]  cetirizine (ZYRTEC) 10 MG tablet Take 10 mg by mouth daily.    [provider]  diphenhydrAMINE (BENADRYL) 25 MG tablet Take 25 mg by mouth at bedtime as needed for sleep.    [provider]  doxazosin  (CARDURA ) 4 MG tablet TAKE 1 TABLET(4 MG) BY MOUTH DAILY 07/28/23   Duanne Butler [name], MD  GLUCOSAMINE-CHONDROITIN PO Take 1 tablet by mouth daily. 750 mg/600 mg    [provider]  hydrochlorothiazide  (HYDRODIURIL ) 25 MG tablet TAKE 1 TABLET(25 MG) BY MOUTH DAILY 06/23/23   Duanne Butler [name], MD  losartan  (COZAAR ) 100 MG tablet TAKE 1 TABLET(100 MG) BY MOUTH DAILY 06/09/23   Duanne Butler [name], MD  Magnesium  250 MG TABS Take 250 mg by mouth daily.    [provider]  melatonin 5 MG TABS Take 5 mg by mouth at bedtime.    [provider]  Multiple Vitamins-Minerals (VITA-MIN PO) Take 1 tablet by mouth daily. One a day    [provider]  Omega-3 Fatty Acids (FISH OIL) 1200 MG CAPS Take 1,200 mg by mouth daily.    [provider]  pantoprazole  (PROTONIX ) 40 MG tablet TAKE 1 TABLET(40 MG) BY MOUTH DAILY 05/20/23   Duanne Butler [name], MD  rosuvastatin  (CRESTOR ) 20 MG tablet TAKE 1 TABLET(20 MG) BY MOUTH DAILY 05/20/23   Duanne Butler [name], MD      Allergies    Cold medicine plus [chlorphen-pseudoephed-apap], Lisinopril, and Penicillins    Review of Systems   Review of Systems  Gastrointestinal:  Positive for abdominal pain.  All other systems reviewed and are negative.   Physical Exam Updated Vital Signs BP 121/76 (BP Location: Right Arm)   Pulse 75   Temp 98.5 F (36.9 C) (Oral)   Resp 16   Ht 1.702 m (5' 7)   Wt 88.5 kg   SpO2 95%   BMI 30.54 kg/m  Physical Exam Vitals and nursing note reviewed.  Constitutional:      General: He is not in acute distress.    Appearance: He is well-developed.  HENT:     Head: Normocephalic and atraumatic.     Mouth/Throat:     Pharynx: No oropharyngeal exudate.  Eyes:     General:  No scleral icterus.       Right eye: No discharge.        Left eye: No discharge.     Conjunctiva/sclera: Conjunctivae normal.     Pupils: Pupils are equal, round, and reactive to light.  Neck:     Thyroid : No thyromegaly.     Vascular: No JVD.  Cardiovascular:     Rate and Rhythm: Normal rate and regular rhythm.     Heart sounds: Normal heart sounds. No murmur heard.    No friction rub. No gallop.  Pulmonary:     Effort: Pulmonary effort is normal. No respiratory distress.     Breath sounds: Normal breath sounds. No wheezing or rales.  Abdominal:     General: Bowel sounds are normal. There is no distension.     Palpations: Abdomen is soft. There is no mass.     Tenderness: There is abdominal tenderness.     Comments: Moderate tenderness in the left lower  quadrant, no upper abdominal tenderness  Musculoskeletal:        General: No tenderness. Normal range of motion.     Cervical back: Normal range of motion and neck supple.     Right lower leg: No edema.     Left lower leg: No edema.  Lymphadenopathy:     Cervical: No cervical adenopathy.  Skin:    General: Skin is warm and dry.     Findings: No erythema or rash.  Neurological:     General: No focal deficit present.     Mental Status: He is alert.     Coordination: Coordination normal.  Psychiatric:        Behavior: Behavior normal.     ED Results / Procedures / Treatments   Labs (all labs ordered are listed, but only abnormal results are displayed) Labs Reviewed  COMPREHENSIVE METABOLIC PANEL - Abnormal; Notable for the following components:      Result Value   Chloride 97 (*)    Glucose, Bld 147 (*)    BUN 28 (*)    All other components within normal limits  RESP PANEL BY RT-PCR (RSV, FLU A&B, COVID)  RVPGX2  LIPASE, BLOOD  CBC  URINALYSIS, ROUTINE W REFLEX MICROSCOPIC    EKG None  Radiology No results found.  Procedures Procedures    Medications Ordered in ED Medications - No data to display  ED Course/ Medical Decision Making/ A&P                                 Medical Decision Making Amount and/or Complexity of Data Reviewed Labs: ordered. Radiology: ordered.  Risk Prescription drug management. Decision regarding hospitalization.    This patient presents to the ED for concern of abdominal pain, this involves an extensive number of treatment options, and is a complaint that carries with it a high risk of complications and morbidity.  The differential diagnosis includes diverticulitis, cystitis, kidney stone, less likely be appendicitis or gallbladder disease, he has never had abdominal surgery   Co morbidities that complicate the patient evaluation  No prior abdominal surgery   Additional history obtained:  Additional history obtained from  medical record including colonoscopy External records from outside source obtained and reviewed including colonoscopy in 2022 showed diverticulosis of the entire colon   Lab Tests:  I Ordered, and personally interpreted labs.  The pertinent results include: Metabolic panel is unremarkable, lipase is normal, CBC is  without leukocytosis or anemia, respiratory panel shows negative flu negative COVID-negative RSV   Imaging Studies ordered:  I ordered imaging studies including CT scan of the abdomen and pelvis I independently visualized and interpreted imaging which showed what appears to be some dilated bowel loops, concerning for bowel obstruction I agree with the radiologist interpretation   Cardiac Monitoring: / EKG:  The patient was maintained on a cardiac monitor.  I personally viewed and interpreted the cardiac monitored which showed an underlying rhythm of: Sinus rhythm   Consultations Obtained:  I requested consultation with the oncoming emergency department physician Dr. Melvenia,  and discussed lab and imaging findings as well as pertinent plan - they recommend: They will follow-up results and disposition accordingly           Final Clinical Impression(s) / ED Diagnoses Final diagnoses:  None    Rx / DC Orders ED Discharge Orders     None         Cleotilde Rogue, MD 07/30/23 1346

## 2023-07-29 NOTE — ED Triage Notes (Signed)
Pt reports mid abd pain since Sunday with n/v, sweats, denies diarrhea, unsure of fevers; hx of gastritis

## 2023-07-29 NOTE — ED Provider Notes (Signed)
 Care of patient assumed from Dr. Cleotilde.  This patient presents with several days of abdominal pain, nausea, vomiting.  Currently awaiting CT scan. Physical Exam  BP 121/76 (BP Location: Right Arm)   Pulse 75   Temp 98.5 F (36.9 C) (Oral)   Resp 16   Ht 5' 7 (1.702 m)   Wt 88.5 kg   SpO2 95%   BMI 30.54 kg/m   Physical Exam Vitals and nursing note reviewed.  Constitutional:      General: He is not in acute distress.    Appearance: He is well-developed. He is not ill-appearing, toxic-appearing or diaphoretic.  HENT:     Head: Normocephalic and atraumatic.     Mouth/Throat:     Mouth: Mucous membranes are moist.  Eyes:     Conjunctiva/sclera: Conjunctivae normal.  Cardiovascular:     Rate and Rhythm: Normal rate and regular rhythm.  Pulmonary:     Effort: Pulmonary effort is normal. No respiratory distress.  Abdominal:     General: There is distension.  Musculoskeletal:        General: No swelling.     Cervical back: Neck supple.  Skin:    General: Skin is warm and dry.  Neurological:     Mental Status: He is alert.  Psychiatric:        Mood and Affect: Mood normal.        Behavior: Behavior normal.     Procedures  Procedures  ED Course / MDM    Medical Decision Making Amount and/or Complexity of Data Reviewed Labs: ordered. Radiology: ordered.  Risk Prescription drug management. Decision regarding hospitalization.   CT scan shows findings consistent with early SBO.  On assessment, patient resting in supine position.  He is agreeable to NG tube and this was ordered.  I spoke with general surgeon, Dr. Kallie, who will see in consult in the morning.  Patient was admitted for further management.       Melvenia Motto, MD 07/30/23 [phone]

## 2023-07-30 ENCOUNTER — Inpatient Hospital Stay (HOSPITAL_COMMUNITY): Payer: Medicare Other

## 2023-07-30 DIAGNOSIS — T476X5A Adverse effect of antidiarrheal drugs, initial encounter: Secondary | ICD-10-CM | POA: Diagnosis present

## 2023-07-30 DIAGNOSIS — K56609 Unspecified intestinal obstruction, unspecified as to partial versus complete obstruction: Secondary | ICD-10-CM | POA: Diagnosis not present

## 2023-07-30 DIAGNOSIS — E785 Hyperlipidemia, unspecified: Secondary | ICD-10-CM | POA: Diagnosis not present

## 2023-07-30 DIAGNOSIS — Z7982 Long term (current) use of aspirin: Secondary | ICD-10-CM | POA: Diagnosis not present

## 2023-07-30 DIAGNOSIS — E66811 Obesity, class 1: Secondary | ICD-10-CM | POA: Diagnosis present

## 2023-07-30 DIAGNOSIS — Z4682 Encounter for fitting and adjustment of non-vascular catheter: Secondary | ICD-10-CM | POA: Diagnosis not present

## 2023-07-30 DIAGNOSIS — R1110 Vomiting, unspecified: Secondary | ICD-10-CM | POA: Diagnosis not present

## 2023-07-30 DIAGNOSIS — N400 Enlarged prostate without lower urinary tract symptoms: Secondary | ICD-10-CM | POA: Diagnosis present

## 2023-07-30 DIAGNOSIS — K219 Gastro-esophageal reflux disease without esophagitis: Secondary | ICD-10-CM

## 2023-07-30 DIAGNOSIS — R109 Unspecified abdominal pain: Secondary | ICD-10-CM | POA: Diagnosis not present

## 2023-07-30 DIAGNOSIS — Z888 Allergy status to other drugs, medicaments and biological substances status: Secondary | ICD-10-CM | POA: Diagnosis not present

## 2023-07-30 DIAGNOSIS — I10 Essential (primary) hypertension: Secondary | ICD-10-CM | POA: Diagnosis not present

## 2023-07-30 DIAGNOSIS — K5669 Other partial intestinal obstruction: Secondary | ICD-10-CM | POA: Diagnosis present

## 2023-07-30 DIAGNOSIS — Z79899 Other long term (current) drug therapy: Secondary | ICD-10-CM | POA: Diagnosis not present

## 2023-07-30 DIAGNOSIS — N390 Urinary tract infection, site not specified: Secondary | ICD-10-CM | POA: Diagnosis present

## 2023-07-30 DIAGNOSIS — Z6830 Body mass index (BMI) 30.0-30.9, adult: Secondary | ICD-10-CM | POA: Diagnosis not present

## 2023-07-30 DIAGNOSIS — Z880 Allergy status to penicillin: Secondary | ICD-10-CM | POA: Diagnosis not present

## 2023-07-30 DIAGNOSIS — Z87891 Personal history of nicotine dependence: Secondary | ICD-10-CM | POA: Diagnosis not present

## 2023-07-30 DIAGNOSIS — Z8673 Personal history of transient ischemic attack (TIA), and cerebral infarction without residual deficits: Secondary | ICD-10-CM | POA: Diagnosis not present

## 2023-07-30 LAB — BASIC METABOLIC PANEL
Anion gap: 11 (ref 5–15)
BUN: 24 mg/dL — ABNORMAL HIGH (ref 8–23)
CO2: 30 mmol/L (ref 22–32)
Calcium: 9.3 mg/dL (ref 8.9–10.3)
Chloride: 96 mmol/L — ABNORMAL LOW (ref 98–111)
Creatinine, Ser: 1.14 mg/dL (ref 0.61–1.24)
GFR, Estimated: >60 mL/min (ref >60)
Glucose, Bld: 126 mg/dL — ABNORMAL HIGH (ref 70–99)
Potassium: 3.5 mmol/L (ref 3.5–5.1)
Sodium: 137 mmol/L (ref 135–145)

## 2023-07-30 LAB — CBC
HCT: 42.3 % (ref 39.0–52.0)
Hemoglobin: 14.8 g/dL (ref 13.0–17.0)
MCH: 30.3 pg (ref 26.0–34.0)
MCHC: 35 g/dL (ref 30.0–36.0)
MCV: 86.7 fL (ref 80.0–100.0)
Platelets: 272 10*3/uL (ref 150–400)
RBC: 4.88 MIL/uL (ref 4.22–5.81)
RDW: 12.8 % (ref 11.5–15.5)
WBC: 5.9 10*3/uL (ref 4.0–10.5)
nRBC: 0 % (ref 0.0–0.2)

## 2023-07-30 MED ORDER — DIATRIZOATE MEGLUMINE & SODIUM 66-10 % PO SOLN
90.0000 mL | Freq: Once | ORAL | Status: AC
Start: 2023-07-30 — End: 2023-07-30
  Administered 2023-07-30: 90 mL via NASOGASTRIC
  Filled 2023-07-30: qty 90

## 2023-07-30 MED ORDER — AMLODIPINE BESYLATE 5 MG PO TABS
10.0000 mg | ORAL_TABLET | Freq: Every day | ORAL | Status: DC
  Administered 2023-07-31: 10 mg via ORAL
  Filled 2023-07-30: qty 2

## 2023-07-30 MED ORDER — SODIUM CHLORIDE 0.9 % IV SOLN: INTRAVENOUS | Status: AC

## 2023-07-30 MED ORDER — LOSARTAN POTASSIUM 50 MG PO TABS
100.0000 mg | ORAL_TABLET | Freq: Every day | ORAL | Status: DC
  Administered 2023-07-31: 100 mg via ORAL
  Filled 2023-07-30: qty 2

## 2023-07-30 MED ORDER — POTASSIUM CHLORIDE 20 MEQ PO PACK: 40.0000 meq | PACK | Freq: Once | ORAL | Status: DC

## 2023-07-30 MED ORDER — ENOXAPARIN SODIUM 40 MG/0.4ML IJ SOSY
40.0000 mg | PREFILLED_SYRINGE | INTRAMUSCULAR | Status: DC
  Administered 2023-07-30 – 2023-07-31 (×2): 40 mg via SUBCUTANEOUS
  Filled 2023-07-30 (×2): qty 0.4

## 2023-07-30 MED ORDER — ACETAMINOPHEN 650 MG RE SUPP: 650.0000 mg | Freq: Four times a day (QID) | RECTAL | Status: DC | PRN

## 2023-07-30 MED ORDER — DOXAZOSIN MESYLATE 4 MG PO TABS
4.0000 mg | ORAL_TABLET | Freq: Every day | ORAL | Status: DC
  Administered 2023-07-31: 4 mg via ORAL
  Filled 2023-07-30 (×2): qty 1
  Filled 2023-07-30: qty 2
  Filled 2023-07-30: qty 1

## 2023-07-30 MED ORDER — MAGNESIUM OXIDE -MG SUPPLEMENT 400 (240 MG) MG PO TABS
200.0000 mg | ORAL_TABLET | Freq: Every day | ORAL | Status: DC
Start: 2023-07-30 — End: 2023-07-31
  Administered 2023-07-31: 200 mg via ORAL
  Filled 2023-07-30: qty 1

## 2023-07-30 MED ORDER — LORATADINE 10 MG PO TABS
10.0000 mg | ORAL_TABLET | Freq: Every day | ORAL | Status: DC
  Administered 2023-07-31: 10 mg via ORAL
  Filled 2023-07-30: qty 1

## 2023-07-30 MED ORDER — OMEGA-3-ACID ETHYL ESTERS 1 G PO CAPS
1.0000 g | ORAL_CAPSULE | Freq: Every day | ORAL | Status: DC
  Administered 2023-07-31: 1 g via ORAL
  Filled 2023-07-30: qty 1

## 2023-07-30 MED ORDER — ROSUVASTATIN CALCIUM 20 MG PO TABS
20.0000 mg | ORAL_TABLET | Freq: Every day | ORAL | Status: DC
  Administered 2023-07-31: 20 mg via ORAL
  Filled 2023-07-30: qty 1

## 2023-07-30 MED ORDER — ONDANSETRON HCL 4 MG PO TABS: 4.0000 mg | ORAL_TABLET | Freq: Four times a day (QID) | ORAL | Status: DC | PRN

## 2023-07-30 MED ORDER — PANTOPRAZOLE SODIUM 40 MG PO TBEC
40.0000 mg | DELAYED_RELEASE_TABLET | Freq: Every day | ORAL | Status: DC
Start: 2023-07-30 — End: 2023-07-31
  Administered 2023-07-31: 40 mg via ORAL
  Filled 2023-07-30: qty 1

## 2023-07-30 MED ORDER — HYDRALAZINE HCL 20 MG/ML IJ SOLN: 10.0000 mg | Freq: Four times a day (QID) | INTRAMUSCULAR | Status: DC | PRN

## 2023-07-30 MED ORDER — MAGNESIUM HYDROXIDE 400 MG/5ML PO SUSP: 30.0000 mL | Freq: Every day | ORAL | Status: DC | PRN

## 2023-07-30 MED ORDER — MELATONIN 3 MG PO TABS
6.0000 mg | ORAL_TABLET | Freq: Every day | ORAL | Status: DC
  Administered 2023-07-30: 6 mg via ORAL
  Filled 2023-07-30: qty 2

## 2023-07-30 MED ORDER — SODIUM CHLORIDE 0.9 % IV SOLN
1.0000 g | INTRAVENOUS | Status: DC
  Administered 2023-07-30 – 2023-07-31 (×2): 1 g via INTRAVENOUS
  Filled 2023-07-30 (×2): qty 10

## 2023-07-30 MED ORDER — ONDANSETRON HCL 4 MG/2ML IJ SOLN: 4.0000 mg | Freq: Four times a day (QID) | INTRAMUSCULAR | Status: DC | PRN

## 2023-07-30 MED ORDER — ADULT MULTIVITAMIN W/MINERALS CH
1.0000 | ORAL_TABLET | Freq: Every day | ORAL | Status: DC
  Administered 2023-07-31: 1 via ORAL
  Filled 2023-07-30: qty 1

## 2023-07-30 MED ORDER — TRAZODONE HCL 50 MG PO TABS: 25.0000 mg | ORAL_TABLET | Freq: Every evening | ORAL | Status: DC | PRN

## 2023-07-30 MED ORDER — ACETAMINOPHEN 325 MG PO TABS: 650.0000 mg | ORAL_TABLET | Freq: Four times a day (QID) | ORAL | Status: DC | PRN

## 2023-07-30 NOTE — Assessment & Plan Note (Addendum)
 -  We will continue statin therapy and Lovaza.
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
## 2023-07-30 NOTE — Progress Notes (Signed)
 Patient admitted earlier this morning for small bowel obstruction and NG tube has been placed.  General surgery consult has been requested.  Patient seen and examined at bedside.  Plan of care discussed with him.  I have reviewed patient's medical records including this morning's H&P, current vitals, labs and medications myself.  Continue IV fluids, n.p.o., NG tube and await general surgery recommendations.

## 2023-07-30 NOTE — ED Notes (Signed)
 Low intermittent suction reestablished. Pt denies complaints of pain or distress. Stomach contents evacuated without incident.

## 2023-07-30 NOTE — Progress Notes (Signed)
 [facility]  NG pulled back some on transport from ED to 200. NG repositioned Kub with it in the stomach. Contrast in SB. Repeat SBFT KUB at 1930 still needed.  Manuelita Pander, MD [facility] [address] [place], [place] [address] [phone] (office)

## 2023-07-30 NOTE — Assessment & Plan Note (Signed)
-   The patient will be placed on as needed IV hydralazine.
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
## 2023-07-30 NOTE — Assessment & Plan Note (Addendum)
-   The patient be admitted to a medical-surgical bed. - Pain management will be provided. - The patient will be kept NPO. - NG tube drainage has been clear so far. - He will be hydrated with IV normal saline with added potassium chloride . - We will obtain follow-up two-view abdomen x-ray. - General Surgery consult will be obtained. - I sent a timed message to Dr. Kallie about the patient for routine consult this morning.

## 2023-07-30 NOTE — Plan of Care (Signed)
 ?  Problem: Education: ?Goal: Knowledge of General Education information will improve ?Description: Including pain rating scale, medication(s)/side effects and non-pharmacologic comfort measures ?Outcome: Progressing ?  ?Problem: Health Behavior/Discharge Planning: ?Goal: Ability to manage health-related needs will improve ?Outcome: Progressing ?  ?Problem: Coping: ?Goal: Level of anxiety will decrease ?Outcome: Progressing ?  ?

## 2023-07-30 NOTE — TOC CM/SW Note (Signed)
 Transition of Care [facility]) - Inpatient Brief Assessment   Patient Details  Name: Anyelo Mccue MRN: [id number] Date of Birth: May 04, 1946  Transition of Care [facility]) CM/SW Contact:    Noreen KATHEE Pinal, LCSWA Phone Number: 07/30/2023, 8:27 AM   Clinical Narrative:  Transition of Care Department [facility]) has reviewed patient and no TOC needs have been identified at this time. We will continue to monitor patient advancement through interdisciplinary progression rounds. If new patient transition needs arise, please place a TOC consult.   Transition of Care Asessment: Insurance and Status: Insurance coverage has been reviewed Patient has primary care physician: Yes Home environment has been reviewed: Single Family home with spouse Prior level of function:: Independent Prior/Current Home Services: No current home services Social Drivers of Health Review: SDOH reviewed no interventions necessary Readmission risk has been reviewed: Yes Transition of care needs: no transition of care needs at this time

## 2023-07-30 NOTE — Progress Notes (Signed)
 [facility]  Contrast is in the colon and but small bowel loops are still dilated.  We'll see if Patient has any bowel movements and get a KUB in the a.m.   If NG comes out overnight can leave out.  Deena Farrier, MD

## 2023-07-30 NOTE — Assessment & Plan Note (Signed)
 -  We will continue PPI therapy

## 2023-07-31 ENCOUNTER — Inpatient Hospital Stay (HOSPITAL_COMMUNITY): Payer: Medicare Other

## 2023-07-31 DIAGNOSIS — K56609 Unspecified intestinal obstruction, unspecified as to partial versus complete obstruction: Secondary | ICD-10-CM | POA: Diagnosis not present

## 2023-07-31 LAB — BASIC METABOLIC PANEL
Anion gap: 11 (ref 5–15)
BUN: 25 mg/dL — ABNORMAL HIGH (ref 8–23)
CO2: 32 mmol/L (ref 22–32)
Calcium: 9.1 mg/dL (ref 8.9–10.3)
Chloride: 96 mmol/L — ABNORMAL LOW (ref 98–111)
Creatinine, Ser: 1.17 mg/dL (ref 0.61–1.24)
GFR, Estimated: >60 mL/min (ref >60)
Glucose, Bld: 102 mg/dL — ABNORMAL HIGH (ref 70–99)
Potassium: 2.9 mmol/L — ABNORMAL LOW (ref 3.5–5.1)
Sodium: 139 mmol/L (ref 135–145)

## 2023-07-31 LAB — URINE CULTURE: Culture: <10000 — AB

## 2023-07-31 LAB — MAGNESIUM: Magnesium: 2.2 mg/dL (ref 1.7–2.4)

## 2023-07-31 MED ORDER — POTASSIUM CHLORIDE CRYS ER 20 MEQ PO TBCR
40.0000 meq | EXTENDED_RELEASE_TABLET | ORAL | Status: AC
  Administered 2023-07-31 (×2): 40 meq via ORAL
  Filled 2023-07-31 (×2): qty 2

## 2023-07-31 MED ORDER — CEPHALEXIN 500 MG PO CAPS: 500.0000 mg | ORAL_CAPSULE | Freq: Three times a day (TID) | ORAL | 0 refills | Status: DC

## 2023-07-31 MED ORDER — SODIUM CHLORIDE 0.9 % IV SOLN: INTRAVENOUS | Status: DC

## 2023-07-31 MED ORDER — ONDANSETRON HCL 4 MG PO TABS: 4.0000 mg | ORAL_TABLET | Freq: Four times a day (QID) | ORAL | 0 refills | Status: DC | PRN

## 2023-07-31 NOTE — Progress Notes (Signed)
 [facility]  Having Bms. Tolerated clears. A little bloated but no nausea/vomiting. Wants to get home.  BP 116/68 (BP Location: Left Arm)   Pulse 71   Temp 98.5 F (36.9 C) (Oral)   Resp 18   Ht 5' 7 (1.702 m)   Wt 88.5 kg   SpO2 95%   BMI 30.54 kg/m  Soft, mildly distended  KUB with contrast in colon, some dilated SB residual   Patient s/p resolving pSBO. Doing better.   Soft diet if does ok can dc home Return precautions discussed Told him to avoid the imodium Told him to do small easy to digest meals for the next week  Follow up with PCP Pickard, Butler [name], MD  Manuelita Pander, MD [facility] [address] [place], [place] [address] [phone] (office)

## 2023-07-31 NOTE — Progress Notes (Signed)
 Pt remained pleasant and A&O x 4 all night. He ambulated independently to restroom with steady gait. He had 3 BM's overnight that were loose and yellow/brown. No obvious blood noted. NG remained intact overnight on low intermittent suction. Output from NG is green and totaled . Pt denies pain. KUB performed this AM and awaiting results. Dr Kallie will be informed of output. No acute events overnight. Kellogg RN

## 2023-07-31 NOTE — Discharge Summary (Signed)
 Physician Discharge Summary  Joseph Dillon FMW:[id number] DOB: Oct 15, 1945 DOA: 07/29/2023  PCP: Duanne Butler [name], MD  Admit date: 07/29/2023 Discharge date: 07/31/2023  Admitted From: Home Disposition: Home  Recommendations for Outpatient Follow-up:  Follow up with PCP in 1 week with repeat CBC/BMP Outpatient follow-up with general surgery if needed Follow up in ED if symptoms worsen or new appear   Home Health: No Equipment/Devices: None  Discharge Condition: Stable CODE STATUS: Full Diet recommendation: Heart healthy.  Soft diet for at least a week as per general surgery recommendations  Brief/Interim Summary: 78 year old male with history of GERD, hypertension, dyslipidemia and osteoarthritis presented with worsening abdominal pain.  On presentation, UA was suggestive of UTI.  CT of abdomen and pelvis was suggestive of possible small bowel obstruction.  He was started on IV fluids and antibiotics.  NG tube was placed.  General surgery was consulted.  He was managed conservatively.  During the hospitalization, his condition has improved.  NG tube has been removed.  He is having bowel movements.  Cultures have remained negative so far.  He wants to go home today.  He was put on clear liquid diet this morning.  If he tolerates soft diet this afternoon, he will be discharged home today.  Outpatient follow-up with PCP.  Discharge Diagnoses:   Possible small bowel obstruction -Unclear cause.  Possibly from recent Imodium intake. -General surgery was consulted.  He was managed conservatively.  During the hospitalization, his condition has improved.  NG tube has been removed.  He is having bowel movements. - He wants to go home today.  He was put on clear liquid diet this morning.  If he tolerates soft diet this afternoon, he will be discharged home today.  General surgery has cleared him for discharge.  Possible UTI: Present on admission -Cultures negative so far.  Currently on Rocephin .   Discharge home today on Keflex  for 5 more days.  Hypertension -Blood pressure intermittently on the lower side.  Continue losartan .  Hydrochlorothiazide  on hold till reevaluation by PCP  Dyslipidemia -Continue statin  BPH -Continue doxazosin   GERD -Continue PPI  Obesity class I -Outpatient follow-up  Discharge Instructions  Discharge Instructions     Diet - low sodium heart healthy   Complete by: As directed    Soft diet   Increase activity slowly   Complete by: As directed       Allergies as of 07/31/2023       Reactions   Cold Medicine Plus [chlorphen-pseudoephed-apap] Other (See Comments)   Contact Cold Medicine - Hives, swelling   Lisinopril Cough   Penicillins Hives   DID THE REACTION INVOLVE: Swelling of the face/tongue/throat, SOB, or low BP? Unknown Sudden or severe rash/hives, skin peeling, or the inside of the mouth or nose? Unknown Did it require medical treatment? Unknown When did it last happen?    Unknown   If all above answers are NO, may proceed with cephalosporin use.        Medication List     STOP taking these medications    hydrochlorothiazide  25 MG tablet Commonly known as: HYDRODIURIL        TAKE these medications    amLODipine  10 MG tablet Commonly known as: NORVASC  Take 1 tablet (10 mg total) by mouth daily.   aspirin EC 81 MG tablet Take 81 mg by mouth daily.   cephALEXin  500 MG capsule Commonly known as: KEFLEX  Take 1 capsule (500 mg total) by mouth 3 (three) times daily for 5  days. Start taking on: August 01, 2023   diphenhydrAMINE 25 MG tablet Commonly known as: BENADRYL Take 25 mg by mouth at bedtime as needed for sleep.   doxazosin  4 MG tablet Commonly known as: CARDURA  TAKE 1 TABLET(4 MG) BY MOUTH DAILY What changed: See the new instructions.   Fish Oil 1200 MG Caps Take 1,200 mg by mouth daily.   losartan  100 MG tablet Commonly known as: COZAAR  TAKE 1 TABLET(100 MG) BY MOUTH DAILY What changed: See  the new instructions.   Magnesium  250 MG Tabs Take 250 mg by mouth daily.   melatonin 5 MG Tabs Take 5 mg by mouth at bedtime as needed (sleep).   Muscle Rub 10-15 % Crea Apply 1 Application topically as needed for muscle pain (muscle pain).   ondansetron  4 MG tablet Commonly known as: ZOFRAN  Take 1 tablet (4 mg total) by mouth every 6 (six) hours as needed for nausea.   pantoprazole  40 MG tablet Commonly known as: PROTONIX  TAKE 1 TABLET(40 MG) BY MOUTH DAILY What changed: See the new instructions.   rosuvastatin  20 MG tablet Commonly known as: CRESTOR  TAKE 1 TABLET(20 MG) BY MOUTH DAILY What changed: See the new instructions.   VITA-MIN PO Take 1 tablet by mouth daily. One a day        Follow-up Information     Duanne Butler [name], MD. Schedule an appointment as soon as possible for a visit in 1 week(s).   Specialty: Family Medicine Contact information: [address] [place] [address] [phone]                Allergies  Allergen Reactions   Cold Medicine Plus [Chlorphen-Pseudoephed-Apap] Other (See Comments)    Contact Cold Medicine - Hives, swelling   Lisinopril Cough   Penicillins Hives    DID THE REACTION INVOLVE: Swelling of the face/tongue/throat, SOB, or low BP? Unknown Sudden or severe rash/hives, skin peeling, or the inside of the mouth or nose? Unknown Did it require medical treatment? Unknown When did it last happen?    Unknown   If all above answers are NO, may proceed with cephalosporin use.     Consultations: General Surgery   Procedures/Studies: DG Abd 1 View Result Date: 07/31/2023 CLINICAL DATA:  Small-bowel obstruction. EXAM: ABDOMEN - 1 VIEW COMPARISON:  CT 07/29/2023.  Radiographs 07/30/2023. FINDINGS: 0627 hours. Two views submitted. This study represents an approximately 19 hour follow-up from original contrast administration yesterday. Tip of the enteric tube is in the distal stomach. The administered contrast  has propagated into the colon which appears nondistended. No significant residual small bowel contrast identified. The small bowel remains moderately dilated. No evidence of contrast extravasation or pneumoperitoneum. IMPRESSION: Propagation of administered contrast into the nondilated colon, excluding high-grade small-bowel obstruction. There are residual moderately dilated loops of small bowel. Electronically Signed   By: Elsie Perone M.D.   On: 07/31/2023 10:51   DG Abd Portable 1V-Small Bowel Obstruction Protocol-initial, 8 hr delay Result Date: 07/30/2023 CLINICAL DATA:  Abdominal pain and vomiting EXAM: PORTABLE ABDOMEN - 1 VIEW COMPARISON:  07/30/2023 FINDINGS: There are dilated loops of small bowel throughout the abdomen. There is enteric contrast seen within the colon. Excreted intravenous contrast is present in the urinary bladder. Osseous structures are unremarkable. IMPRESSION: Mildly dilated loops of small bowel throughout the abdomen, as demonstrated on earlier CT. Electronically Signed   By: Franky Stanford M.D.   On: 07/30/2023 20:12   DG Abd 1 View Result  Date: 07/30/2023 CLINICAL DATA:  NG tube EXAM: ABDOMEN - 1 VIEW COMPARISON:  Abdominal x-ray 07/30/2023 FINDINGS: Nasogastric tube tip is in the proximal stomach just beyond the gastroesophageal junction. IMPRESSION: Nasogastric tube tip is in the proximal stomach just beyond the gastroesophageal junction. Recommend advancing tube 6 cm. Electronically Signed   By: Greig Pique M.D.   On: 07/30/2023 16:44   DG Abd Portable 1V-Small Bowel Protocol-Position Verification Result Date: 07/30/2023 CLINICAL DATA:  Nasogastric tube placement confirmation prior to contrast injection for small-bowel study protocol. EXAM: PORTABLE ABDOMEN - 1 VIEW COMPARISON:  Earlier today. FINDINGS: The nasogastric tube tip remains in the proximal to mid stomach. The side hole is not well visualized the side hole may be at the inferior aspect of the gastroesophageal  junction. No significant change in mildly dilated small bowel loops. Lumbar and lower thoracic spine degenerative changes. IMPRESSION: The nasogastric tube tip remains in the proximal to mid stomach. The side hole is not well visualized and may be at the inferior aspect of the gastroesophageal junction. Recommend that the tube be advanced 5 cm prior to contrast injection. Electronically Signed   By: Elspeth Bathe M.D.   On: 07/30/2023 11:43   DG Abdomen 1 View Result Date: 07/30/2023 CLINICAL DATA:  Nasogastric tube placement EXAM: ABDOMEN - 1 VIEW COMPARISON:  None Available. FINDINGS: The bowel gas pattern is normal. No radio-opaque calculi or other significant radiographic abnormality are seen. NG tube tip and side port are in the stomach. IMPRESSION: NG tube tip and side port in the stomach. Electronically Signed   By: Franky Stanford M.D.   On: 07/30/2023 01:07   CT ABDOMEN PELVIS W CONTRAST Result Date: 07/29/2023 CLINICAL DATA:  Left lower quadrant abdominal pain EXAM: CT ABDOMEN AND PELVIS WITH CONTRAST TECHNIQUE: Multidetector CT imaging of the abdomen and pelvis was performed using the standard protocol following bolus administration of intravenous contrast. RADIATION DOSE REDUCTION: This exam was performed according to the departmental dose-optimization program which includes automated exposure control, adjustment of the mA and/or kV according to patient size and/or use of iterative reconstruction technique. CONTRAST:  OMNIPAQUE  IOHEXOL  300 MG/ML  SOLN COMPARISON:  None Available. FINDINGS: Lower Chest: Normal. Hepatobiliary: Normal hepatic contours. No intra- or extrahepatic biliary dilatation. The gallbladder is normal. Pancreas: Normal pancreas. No ductal dilatation or peripancreatic fluid collection. Spleen: Normal. Adrenals/Urinary Tract: The adrenal glands are normal. No hydronephrosis, nephroureterolithiasis or solid renal mass. The urinary bladder is normal for degree of distention  Stomach/Bowel: There is no hiatal hernia. Normal duodenal course and caliber. Mildly dilated jejunum without a clear transition point. No focal colonic abnormality. Normal appendix. Vascular/Lymphatic: There is calcific atherosclerosis of the abdominal aorta. No lymphadenopathy. Reproductive: Normal prostate size with symmetric seminal vesicles. Other: None. Musculoskeletal: No bony spinal canal stenosis or focal osseous abnormality. IMPRESSION: 1. Mildly dilated jejunum without a clear transition point, possibly early small bowel obstruction. 2.  Aortic atherosclerosis (ICD10-I70.0). Electronically Signed   By: Franky Stanford M.D.   On: 07/29/2023 23:59      Subjective: Patient seen and examined at bedside.  Feels much better and wants to go home today.  Having bowel movements.  Denies worsening abdominal pain or vomiting.  Discharge Exam: Vitals:   07/30/23 2352 07/31/23 0342  BP: 125/77 116/68  Pulse: 79 71  Resp: 20 18  Temp: 98.2 F (36.8 C) 98.5 F (36.9 C)  SpO2: 96% 95%    General: Pt is alert, awake, not in acute distress.  On room air. Cardiovascular: rate controlled, S1/S2 + Respiratory: bilateral decreased breath sounds at bases Abdominal: Soft, obese, NT, ND, bowel sounds + Extremities: no edema, no cyanosis    The results of significant diagnostics from this hospitalization (including imaging, microbiology, ancillary and laboratory) are listed below for reference.     Microbiology: Recent Results (from the past 240 hours)  Resp panel by RT-PCR (RSV, Flu A&B, Covid) Anterior Nasal Swab     Status: None   Collection Time: 07/29/23  7:55 PM   Specimen: Anterior Nasal Swab  Result Value Ref Range Status   SARS Coronavirus 2 by RT PCR NEGATIVE NEGATIVE Final    Comment: (NOTE) SARS-CoV-2 target nucleic acids are NOT DETECTED.  The SARS-CoV-2 RNA is generally detectable in upper respiratory specimens during the acute phase of infection. The lowest concentration of  SARS-CoV-2 viral copies this assay can detect is 138 copies/mL. A negative result does not preclude SARS-Cov-2 infection and should not be used as the sole basis for treatment or other patient management decisions. A negative result may occur with  improper specimen collection/handling, submission of specimen other than nasopharyngeal swab, presence of viral mutation(s) within the areas targeted by this assay, and inadequate number of viral copies(<138 copies/mL). A negative result must be combined with clinical observations, patient history, and epidemiological information. The expected result is Negative.  Fact Sheet for Patients:  bloggercourse.com  Fact Sheet for Healthcare Providers:  seriousbroker.it  This test is no t yet approved or cleared by the [place]  FDA and  has been authorized for detection and/or diagnosis of SARS-CoV-2 by FDA under an Emergency Use Authorization (EUA). This EUA will remain  in effect (meaning this test can be used) for the duration of the COVID-19 declaration under Section 564(b)(1) of the Act, 21 U.S.C.section 360bbb-3(b)(1), unless the authorization is terminated  or revoked sooner.       Influenza A by PCR NEGATIVE NEGATIVE Final   Influenza B by PCR NEGATIVE NEGATIVE Final    Comment: (NOTE) The Xpert Xpress SARS-CoV-2/FLU/RSV plus assay is intended as an aid in the diagnosis of influenza from Nasopharyngeal swab specimens and should not be used as a sole basis for treatment. Nasal washings and aspirates are unacceptable for Xpert Xpress SARS-CoV-2/FLU/RSV testing.  Fact Sheet for Patients: bloggercourse.com  Fact Sheet for Healthcare Providers: seriousbroker.it  This test is not yet approved or cleared by the [place]  FDA and has been authorized for detection and/or diagnosis of SARS-CoV-2 by FDA under an Emergency Use  Authorization (EUA). This EUA will remain in effect (meaning this test can be used) for the duration of the COVID-19 declaration under Section 564(b)(1) of the Act, 21 U.S.C. section 360bbb-3(b)(1), unless the authorization is terminated or revoked.     Resp Syncytial Virus by PCR NEGATIVE NEGATIVE Final    Comment: (NOTE) Fact Sheet for Patients: bloggercourse.com  Fact Sheet for Healthcare Providers: seriousbroker.it  This test is not yet approved or cleared by the [place]  FDA and has been authorized for detection and/or diagnosis of SARS-CoV-2 by FDA under an Emergency Use Authorization (EUA). This EUA will remain in effect (meaning this test can be used) for the duration of the COVID-19 declaration under Section 564(b)(1) of the Act, 21 U.S.C. section 360bbb-3(b)(1), unless the authorization is terminated or revoked.  Performed at [facility], [address] [place], [place] [address]      Labs: BNP (last 3 results) No results for input(s): BNP in the last 8760 hours.  Basic Metabolic Panel: Recent Labs  Lab 07/29/23 2009 07/30/23 0318 07/31/23 0418  NA 138 137 139  K 3.5 3.5 2.9*  CL 97* 96* 96*  CO2 30 30 32  GLUCOSE 147* 126* 102*  BUN 28* 24* 25*  CREATININE 1.23 1.14 1.17  CALCIUM  9.4 9.3 9.1  MG  --   --  2.2   Liver Function Tests: Recent Labs  Lab 07/29/23 2009  AST 24  ALT 30  ALKPHOS 62  BILITOT 1.1  PROT 7.0  ALBUMIN 3.9   Recent Labs  Lab 07/29/23 2009  LIPASE 33   No results for input(s): AMMONIA in the last 168 hours. CBC: Recent Labs  Lab 07/29/23 2009 07/30/23 0318  WBC 7.3 5.9  HGB 15.7 14.8  HCT 45.5 42.3  MCV 87.3 86.7  PLT 271 272   Cardiac Enzymes: No results for input(s): CKTOTAL, CKMB, CKMBINDEX, TROPONINI in the last 168 hours. BNP: Invalid input(s): POCBNP CBG: No results for input(s): GLUCAP in the last 168 hours. D-Dimer No results for  input(s): DDIMER in the last 72 hours. Hgb A1c No results for input(s): HGBA1C in the last 72 hours. Lipid Profile No results for input(s): CHOL, HDL, LDLCALC, TRIG, CHOLHDL, LDLDIRECT in the last 72 hours. Thyroid  function studies No results for input(s): TSH, T4TOTAL, T3FREE, THYROIDAB in the last 72 hours.  Invalid input(s): FREET3 Anemia work up No results for input(s): VITAMINB12, FOLATE, FERRITIN, TIBC, IRON, RETICCTPCT in the last 72 hours. Urinalysis    Component Value Date/Time   COLORURINE YELLOW 07/29/2023 2200   APPEARANCEUR HAZY (A) 07/29/2023 2200   LABSPEC 1.019 07/29/2023 2200   PHURINE 5.0 07/29/2023 2200   GLUCOSEU NEGATIVE 07/29/2023 2200   HGBUR SMALL (A) 07/29/2023 2200   BILIRUBINUR NEGATIVE 07/29/2023 2200   KETONESUR 5 (A) 07/29/2023 2200   PROTEINUR NEGATIVE 07/29/2023 2200   NITRITE NEGATIVE 07/29/2023 2200   LEUKOCYTESUR NEGATIVE 07/29/2023 2200   Sepsis Labs Recent Labs  Lab 07/29/23 2009 07/30/23 0318  WBC 7.3 5.9   Microbiology Recent Results (from the past 240 hours)  Resp panel by RT-PCR (RSV, Flu A&B, Covid) Anterior Nasal Swab     Status: None   Collection Time: 07/29/23  7:55 PM   Specimen: Anterior Nasal Swab  Result Value Ref Range Status   SARS Coronavirus 2 by RT PCR NEGATIVE NEGATIVE Final    Comment: (NOTE) SARS-CoV-2 target nucleic acids are NOT DETECTED.  The SARS-CoV-2 RNA is generally detectable in upper respiratory specimens during the acute phase of infection. The lowest concentration of SARS-CoV-2 viral copies this assay can detect is 138 copies/mL. A negative result does not preclude SARS-Cov-2 infection and should not be used as the sole basis for treatment or other patient management decisions. A negative result may occur with  improper specimen collection/handling, submission of specimen other than nasopharyngeal swab, presence of viral mutation(s) within the areas targeted by  this assay, and inadequate number of viral copies(<138 copies/mL). A negative result must be combined with clinical observations, patient history, and epidemiological information. The expected result is Negative.  Fact Sheet for Patients:  bloggercourse.com  Fact Sheet for Healthcare Providers:  seriousbroker.it  This test is no t yet approved or cleared by the [place]  FDA and  has been authorized for detection and/or diagnosis of SARS-CoV-2 by FDA under an Emergency Use Authorization (EUA). This EUA will remain  in effect (meaning this test can be used) for the duration of the COVID-19 declaration under Section 564(b)(1) of  the Act, 21 U.S.C.section 360bbb-3(b)(1), unless the authorization is terminated  or revoked sooner.       Influenza A by PCR NEGATIVE NEGATIVE Final   Influenza B by PCR NEGATIVE NEGATIVE Final    Comment: (NOTE) The Xpert Xpress SARS-CoV-2/FLU/RSV plus assay is intended as an aid in the diagnosis of influenza from Nasopharyngeal swab specimens and should not be used as a sole basis for treatment. Nasal washings and aspirates are unacceptable for Xpert Xpress SARS-CoV-2/FLU/RSV testing.  Fact Sheet for Patients: bloggercourse.com  Fact Sheet for Healthcare Providers: seriousbroker.it  This test is not yet approved or cleared by the [place]  FDA and has been authorized for detection and/or diagnosis of SARS-CoV-2 by FDA under an Emergency Use Authorization (EUA). This EUA will remain in effect (meaning this test can be used) for the duration of the COVID-19 declaration under Section 564(b)(1) of the Act, 21 U.S.C. section 360bbb-3(b)(1), unless the authorization is terminated or revoked.     Resp Syncytial Virus by PCR NEGATIVE NEGATIVE Final    Comment: (NOTE) Fact Sheet for Patients: bloggercourse.com  Fact Sheet  for Healthcare Providers: seriousbroker.it  This test is not yet approved or cleared by the [place]  FDA and has been authorized for detection and/or diagnosis of SARS-CoV-2 by FDA under an Emergency Use Authorization (EUA). This EUA will remain in effect (meaning this test can be used) for the duration of the COVID-19 declaration under Section 564(b)(1) of the Act, 21 U.S.C. section 360bbb-3(b)(1), unless the authorization is terminated or revoked.  Performed at [facility], [address] [place], [place] [address]      Time coordinating discharge: 35 minutes  SIGNED:   Sophie Mao, MD  [facility] 07/31/2023, 12:42 PM

## 2023-07-31 NOTE — Care Management Important Message (Signed)
 Important Message  Patient Details  Name: Haynes Giannotti MRN: [id number] Date of Birth: [birth year]   Important Message Given:  N/A - LOS <3 / Initial given by admissions     Neila Bally 07/31/2023, 2:59 PM

## 2023-08-01 ENCOUNTER — Telehealth: Payer: Self-pay

## 2023-08-01 NOTE — Transitions of Care (Post Inpatient/ED Visit) (Signed)
 08/01/2023  Name: Joseph Dillon MRN: [id number] DOB: 1945/07/09  Today's TOC FU Call Status: Today's TOC FU Call Status:: Successful TOC FU Call Completed TOC FU Call Complete Date: 08/01/23 Patient's Name and Date of Birth confirmed.  Transition Care Management Follow-up Telephone Call Date of Discharge: 07/31/23 Dis[facility] Type of Discharge: Inpatient Admission Primary Inpatient Discharge Diagnosis:: intestional obstruction How have you been since you were released from the hospital?: Better Any questions or concerns?: No  Items Reviewed: Did you receive and understand the discharge instructions provided?: Yes Medications obtained,verified, and reconciled?: Yes (Medications Reviewed) Any new allergies since your discharge?: No Dietary orders reviewed?: Yes Do you have support at home?: Yes People in Home: spouse  Medications Reviewed Today: Medications Reviewed Today     Reviewed by Emmitt Pan, LPN (Licensed Practical Nurse) on 08/01/23 at 0920  Med List Status: <None>   Medication Order Taking? Sig Documenting Provider Last Dose Status Informant  amLODipine  (NORVASC ) 10 MG tablet [id number] No Take 1 tablet (10 mg total) by mouth daily. Duanne Butler [name], MD Past Week Active Self  aspirin EC 81 MG tablet [id number] No Take 81 mg by mouth daily. [provider] Past Week Active Self  cephALEXin  (KEFLEX ) 500 MG capsule [id number]  Take 1 capsule (500 mg total) by mouth 3 (three) times daily for 5 days. Cheryle Page, MD  Active   diphenhydrAMINE (BENADRYL) 25 MG tablet [id number] No Take 25 mg by mouth at bedtime as needed for sleep. [provider] Past Week Active Self  doxazosin  (CARDURA ) 4 MG tablet [id number] No TAKE 1 TABLET(4 MG) BY MOUTH DAILY  Patient taking differently: Take 4 mg by mouth daily.   Duanne Butler [name], MD Past Week Active Self  losartan  (COZAAR ) 100 MG tablet [id number] No TAKE 1 TABLET(100 MG) BY MOUTH DAILY   Patient taking differently: Take 100 mg by mouth daily.   Duanne Butler [name], MD Past Week Active Self  Magnesium  250 MG TABS [id number] No Take 250 mg by mouth daily. [provider] Past Week Active Self  melatonin 5 MG TABS [id number] No Take 5 mg by mouth at bedtime as needed (sleep). [provider] Past Month Active Self  Menthol-Methyl Salicylate (MUSCLE RUB) 10-15 % CREA [id number] No Apply 1 Application topically as needed for muscle pain (muscle pain). [provider] Past Week Active Self  Multiple Vitamins-Minerals (VITA-MIN PO) [id number] No Take 1 tablet by mouth daily. One a day [provider] Past Week Active Self  Omega-3 Fatty Acids (FISH OIL) 1200 MG CAPS [id number] No Take 1,200 mg by mouth daily. [provider] Past Week Active Self  ondansetron  (ZOFRAN ) 4 MG tablet [id number]  Take 1 tablet (4 mg total) by mouth every 6 (six) hours as needed for nausea. Cheryle Page, MD  Active   pantoprazole  (PROTONIX ) 40 MG tablet [id number] No TAKE 1 TABLET(40 MG) BY MOUTH DAILY  Patient taking differently: Take 40 mg by mouth daily.   Duanne Butler [name], MD Past Week Active Self  rosuvastatin  (CRESTOR ) 20 MG tablet [id number] No TAKE 1 TABLET(20 MG) BY MOUTH DAILY  Patient taking differently: Take 20 mg by mouth daily.   Duanne Butler [name], MD Past Week Active Self            Home Care and Equipment/Supplies: Were Home Health Services Ordered?: NA Any new equipment or medical supplies ordered?: NA  Functional Questionnaire: Do you need assistance with bathing/showering or dressing?: No Do you need  assistance with meal preparation?: No Do you need assistance with eating?: No Do you have difficulty maintaining continence: No Do you need assistance with getting out of bed/getting out of a chair/moving?: No Do you have difficulty managing or taking your medications?: No  Follow up appointments reviewed: PCP Follow-up appointment  confirmed?: Yes Date of PCP follow-up appointment?: 08/05/23 Follow-up Provider: [facility] Follow-up appointment confirmed?: NA Do you need transportation to your follow-up appointment?: No Do you understand care options if your condition(s) worsen?: Yes-patient verbalized understanding    SIGNATURE Julian Lemmings, LPN [facility] Nurse Health Advisor Direct Dial [phone]

## 2023-08-05 ENCOUNTER — Encounter: Payer: Self-pay | Admitting: Family Medicine

## 2023-08-05 ENCOUNTER — Ambulatory Visit (INDEPENDENT_AMBULATORY_CARE_PROVIDER_SITE_OTHER): Payer: Medicare Other | Admitting: Family Medicine

## 2023-08-05 VITALS — BP 120/64 | HR 76 | Temp 98.5°F | Ht 67.0 in | Wt 199.4 lb

## 2023-08-05 DIAGNOSIS — E876 Hypokalemia: Secondary | ICD-10-CM

## 2023-08-05 DIAGNOSIS — Z23 Encounter for immunization: Secondary | ICD-10-CM
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
## 2023-08-06 LAB — CBC WITH DIFFERENTIAL/PLATELET
Absolute Lymphocytes: 2430 {cells}/uL (ref 850–3900)
Absolute Monocytes: 784 {cells}/uL (ref 200–950)
Basophils Absolute: 45 {cells}/uL (ref 0–200)
Basophils Relative: 0.4 %
Eosinophils Absolute: 101 {cells}/uL (ref 15–500)
Eosinophils Relative: 0.9 %
HCT: 40.1 % (ref 38.5–50.0)
Hemoglobin: 13.3 g/dL (ref 13.2–17.1)
MCH: 29.4 pg (ref 27.0–33.0)
MCHC: 33.2 g/dL (ref 32.0–36.0)
MCV: 88.7 fL (ref 80.0–100.0)
MPV: 10.6 fL (ref 7.5–12.5)
Monocytes Relative: 7 %
Neutro Abs: 7840 {cells}/uL — ABNORMAL HIGH (ref 1500–7800)
Neutrophils Relative %: 70 %
Platelets: 267 10*3/uL (ref 140–400)
RBC: 4.52 10*6/uL (ref 4.20–5.80)
RDW: 11.9 % (ref 11.0–15.0)
Total Lymphocyte: 21.7 %
WBC: 11.2 10*3/uL — ABNORMAL HIGH (ref 3.8–10.8)

## 2023-08-06 LAB — COMPLETE METABOLIC PANEL WITH GFR
AG Ratio: 1.8 (calc) (ref 1.0–2.5)
ALT: 44 U/L (ref 9–46)
AST: 27 U/L (ref 10–35)
Albumin: 4 g/dL (ref 3.6–5.1)
Alkaline phosphatase (APISO): 71 U/L (ref 35–144)
BUN/Creatinine Ratio: 12 (calc) (ref 6–22)
BUN: 16 mg/dL (ref 7–25)
CO2: 33 mmol/L — ABNORMAL HIGH (ref 20–32)
Calcium: 9.3 mg/dL (ref 8.6–10.3)
Chloride: 103 mmol/L (ref 98–110)
Creat: 1.31 mg/dL — ABNORMAL HIGH (ref 0.70–1.28)
Globulin: 2.2 g/dL (ref 1.9–3.7)
Glucose, Bld: 130 mg/dL — ABNORMAL HIGH (ref 65–99)
Potassium: 4.2 mmol/L (ref 3.5–5.3)
Sodium: 142 mmol/L (ref 135–146)
Total Bilirubin: 0.3 mg/dL (ref 0.2–1.2)
Total Protein: 6.2 g/dL (ref 6.1–8.1)
eGFR: 56 mL/min/{1.73_m2} — ABNORMAL LOW (ref >=60)

## 2023-08-16 ENCOUNTER — Other Ambulatory Visit: Payer: Self-pay | Admitting: Family Medicine

## 2023-08-22 ENCOUNTER — Other Ambulatory Visit: Payer: Self-pay | Admitting: Family Medicine

## 2023-09-12 ENCOUNTER — Ambulatory Visit (INDEPENDENT_AMBULATORY_CARE_PROVIDER_SITE_OTHER): Payer: Medicare Other | Admitting: Family Medicine

## 2023-09-12 ENCOUNTER — Encounter: Payer: Self-pay | Admitting: Family Medicine

## 2023-09-12 VITALS — BP 128/76 | HR 77 | Temp 97.9°F | Ht 67.0 in | Wt 200.5 lb

## 2023-09-12 DIAGNOSIS — R7303 Prediabetes: Secondary | ICD-10-CM | POA: Diagnosis not present

## 2023-09-12 DIAGNOSIS — I10 Essential (primary) hypertension: Secondary | ICD-10-CM

## 2023-09-12 DIAGNOSIS — Z0001 Encounter for general adult medical examination with abnormal findings: Secondary | ICD-10-CM

## 2023-09-12 DIAGNOSIS — E7800 Pure hypercholesterolemia, unspecified: Secondary | ICD-10-CM | POA: Diagnosis not present

## 2023-09-12 DIAGNOSIS — Z125 Encounter for screening for malignant neoplasm of prostate: Secondary | ICD-10-CM | POA: Diagnosis not present

## 2023-09-12 DIAGNOSIS — Z0000 Encounter for general adult medical examination without abnormal findings: Secondary | ICD-10-CM
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
## 2023-09-13 LAB — CBC WITH DIFFERENTIAL/PLATELET
Absolute Lymphocytes: 1992 {cells}/uL (ref 850–3900)
Absolute Monocytes: 551 {cells}/uL (ref 200–950)
Basophils Absolute: 41 {cells}/uL (ref 0–200)
Basophils Relative: 0.6 %
Eosinophils Absolute: 68 {cells}/uL (ref 15–500)
Eosinophils Relative: 1 %
HCT: 42.1 % (ref 38.5–50.0)
Hemoglobin: 14.1 g/dL (ref 13.2–17.1)
MCH: 29.6 pg (ref 27.0–33.0)
MCHC: 33.5 g/dL (ref 32.0–36.0)
MCV: 88.4 fL (ref 80.0–100.0)
MPV: 10.8 fL (ref 7.5–12.5)
Monocytes Relative: 8.1 %
Neutro Abs: 4148 {cells}/uL (ref 1500–7800)
Neutrophils Relative %: 61 %
Platelets: 245 10*3/uL (ref 140–400)
RBC: 4.76 10*6/uL (ref 4.20–5.80)
RDW: 12.9 % (ref 11.0–15.0)
Total Lymphocyte: 29.3 %
WBC: 6.8 10*3/uL (ref 3.8–10.8)

## 2023-09-13 LAB — COMPLETE METABOLIC PANEL WITH GFR
AG Ratio: 1.9 (calc) (ref 1.0–2.5)
ALT: 26 U/L (ref 9–46)
AST: 24 U/L (ref 10–35)
Albumin: 4.6 g/dL (ref 3.6–5.1)
Alkaline phosphatase (APISO): 62 U/L (ref 35–144)
BUN: 18 mg/dL (ref 7–25)
CO2: 27 mmol/L (ref 20–32)
Calcium: 9.7 mg/dL (ref 8.6–10.3)
Chloride: 105 mmol/L (ref 98–110)
Creat: 1.2 mg/dL (ref 0.70–1.28)
Globulin: 2.4 g/dL (ref 1.9–3.7)
Glucose, Bld: 130 mg/dL — ABNORMAL HIGH (ref 65–99)
Potassium: 4.1 mmol/L (ref 3.5–5.3)
Sodium: 141 mmol/L (ref 135–146)
Total Bilirubin: 0.7 mg/dL (ref 0.2–1.2)
Total Protein: 7 g/dL (ref 6.1–8.1)

## 2023-09-13 LAB — HEMOGLOBIN A1C
Hgb A1c MFr Bld: 6 %{Hb} — ABNORMAL HIGH (ref <5.7)
Mean Plasma Glucose: 126 mg/dL
eAG (mmol/L): 7 mmol/L

## 2023-09-13 LAB — PSA: PSA: 6.8 ng/mL — ABNORMAL HIGH (ref <=4.00)

## 2023-09-16 ENCOUNTER — Other Ambulatory Visit: Payer: Self-pay

## 2023-09-16 DIAGNOSIS — R972 Elevated prostate specific antigen [PSA]: Secondary | ICD-10-CM

## 2023-09-16 MED ORDER — SULFAMETHOXAZOLE-TRIMETHOPRIM 800-160 MG PO TABS: 1.0000 | ORAL_TABLET | Freq: Two times a day (BID) | ORAL | 0 refills | Status: DC

## 2023-10-02 ENCOUNTER — Ambulatory Visit: Payer: Self-pay

## 2023-10-02 ENCOUNTER — Emergency Department (HOSPITAL_COMMUNITY)
Admission: EM | Admit: 2023-10-02 | Discharge: 2023-10-02 | Disposition: A | Discharge status: Home / Self Care | Attending: Emergency Medicine | Admitting: Emergency Medicine

## 2023-10-02 ENCOUNTER — Emergency Department (HOSPITAL_COMMUNITY)

## 2023-10-02 DIAGNOSIS — Z79899 Other long term (current) drug therapy: Secondary | ICD-10-CM | POA: Diagnosis not present

## 2023-10-02 DIAGNOSIS — I10 Essential (primary) hypertension: Secondary | ICD-10-CM | POA: Diagnosis not present

## 2023-10-02 DIAGNOSIS — R0602 Shortness of breath: Secondary | ICD-10-CM | POA: Insufficient documentation

## 2023-10-02 DIAGNOSIS — E860 Dehydration: Secondary | ICD-10-CM | POA: Diagnosis not present

## 2023-10-02 DIAGNOSIS — J9801 Acute bronchospasm: Secondary | ICD-10-CM

## 2023-10-02 DIAGNOSIS — J40 Bronchitis, not specified as acute or chronic: Secondary | ICD-10-CM | POA: Diagnosis not present

## 2023-10-02 DIAGNOSIS — R918 Other nonspecific abnormal finding of lung field: Secondary | ICD-10-CM | POA: Diagnosis not present

## 2023-10-02 DIAGNOSIS — Z7982 Long term (current) use of aspirin: Secondary | ICD-10-CM | POA: Insufficient documentation

## 2023-10-02 LAB — CBC WITH DIFFERENTIAL/PLATELET
Abs Immature Granulocytes: 0.02 10*3/uL (ref 0.00–0.07)
Basophils Absolute: 0 10*3/uL (ref 0.0–0.1)
Basophils Relative: 0 %
Eosinophils Absolute: 0.3 10*3/uL (ref 0.0–0.5)
Eosinophils Relative: 3 %
HCT: 38.2 % — ABNORMAL LOW (ref 39.0–52.0)
Hemoglobin: 12.8 g/dL — ABNORMAL LOW (ref 13.0–17.0)
Immature Granulocytes: 0 %
Lymphocytes Relative: 15 %
Lymphs Abs: 1.2 10*3/uL (ref 0.7–4.0)
MCH: 29.3 pg (ref 26.0–34.0)
MCHC: 33.5 g/dL (ref 30.0–36.0)
MCV: 87.4 fL (ref 80.0–100.0)
Monocytes Absolute: 1.2 10*3/uL — ABNORMAL HIGH (ref 0.1–1.0)
Monocytes Relative: 14 %
Neutro Abs: 5.7 10*3/uL (ref 1.7–7.7)
Neutrophils Relative %: 68 %
Platelets: 212 10*3/uL (ref 150–400)
RBC: 4.37 MIL/uL (ref 4.22–5.81)
RDW: 13.4 % (ref 11.5–15.5)
WBC: 8.4 10*3/uL (ref 4.0–10.5)
nRBC: 0 % (ref 0.0–0.2)

## 2023-10-02 LAB — COMPREHENSIVE METABOLIC PANEL WITH GFR
ALT: 88 U/L — ABNORMAL HIGH (ref 0–44)
AST: 55 U/L — ABNORMAL HIGH (ref 15–41)
Albumin: 3.8 g/dL (ref 3.5–5.0)
Alkaline Phosphatase: 254 U/L — ABNORMAL HIGH (ref 38–126)
Anion gap: 7 (ref 5–15)
BUN: 28 mg/dL — ABNORMAL HIGH (ref 8–23)
CO2: 25 mmol/L (ref 22–32)
Calcium: 9.1 mg/dL (ref 8.9–10.3)
Chloride: 104 mmol/L (ref 98–111)
Creatinine, Ser: 1.69 mg/dL — ABNORMAL HIGH (ref 0.61–1.24)
GFR, Estimated: 42 mL/min — ABNORMAL LOW (ref >60)
Glucose, Bld: 115 mg/dL — ABNORMAL HIGH (ref 70–99)
Potassium: 4.1 mmol/L (ref 3.5–5.1)
Sodium: 136 mmol/L (ref 135–145)
Total Bilirubin: 0.8 mg/dL (ref 0.0–1.2)
Total Protein: 7.1 g/dL (ref 6.5–8.1)

## 2023-10-02 LAB — RESP PANEL BY RT-PCR (RSV, FLU A&B, COVID)  RVPGX2
Influenza A by PCR: NEGATIVE
Influenza B by PCR: NEGATIVE
Resp Syncytial Virus by PCR: NEGATIVE
SARS Coronavirus 2 by RT PCR: NEGATIVE

## 2023-10-02 MED ORDER — MAGNESIUM SULFATE 2 GM/50ML IV SOLN
2.0000 g | Freq: Once | INTRAVENOUS | Status: AC
  Administered 2023-10-02: 2 g via INTRAVENOUS
  Filled 2023-10-02: qty 50

## 2023-10-02 MED ORDER — DOXYCYCLINE HYCLATE 100 MG PO CAPS: 100.0000 mg | ORAL_CAPSULE | Freq: Two times a day (BID) | ORAL | 0 refills | Status: AC

## 2023-10-02 MED ORDER — IPRATROPIUM-ALBUTEROL 0.5-2.5 (3) MG/3ML IN SOLN
3.0000 mL | Freq: Once | RESPIRATORY_TRACT | Status: AC
  Administered 2023-10-02: 3 mL via RESPIRATORY_TRACT
  Filled 2023-10-02: qty 3

## 2023-10-02 MED ORDER — ALBUTEROL SULFATE (2.5 MG/3ML) 0.083% IN NEBU
2.5000 mg | INHALATION_SOLUTION | Freq: Once | RESPIRATORY_TRACT | Status: AC
  Administered 2023-10-02: 2.5 mg via RESPIRATORY_TRACT
  Filled 2023-10-02: qty 3

## 2023-10-02 MED ORDER — ALBUTEROL SULFATE HFA 108 (90 BASE) MCG/ACT IN AERS
2.0000 | INHALATION_SPRAY | Freq: Once | RESPIRATORY_TRACT | Status: AC
Start: 2023-10-02 — End: 2023-10-02
  Administered 2023-10-02: 2 via RESPIRATORY_TRACT
  Filled 2023-10-02: qty 6.7

## 2023-10-02 MED ORDER — METHYLPREDNISOLONE SODIUM SUCC 125 MG IJ SOLR
125.0000 mg | Freq: Once | INTRAMUSCULAR | Status: AC
  Administered 2023-10-02: 125 mg via INTRAVENOUS
  Filled 2023-10-02: qty 2

## 2023-10-02 NOTE — ED Triage Notes (Signed)
 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

## 2023-10-02 NOTE — Telephone Encounter (Signed)
 Copied from CRM [id number]. Topic: Clinical - Red Word Triage >> Oct 02, 2023  2:00 PM Joseph Dillon B wrote: [facility] that prompted transfer to Nurse Triage: PT wife calling in due to husband having a hard time breathing   Chief Complaint: Sinus congestion and pain  Symptoms: Sinus congestion, cough, runny nose  Frequency: Constant  Disposition: [] ED /[] Urgent Care (no appt availability in office) / [] Appointment(In office/virtual)/ []  [facility]/ [x] Home Care/ [] Refused Recommended Disposition /[] [facility]/ []  Follow-up with PCP Additional Notes: Patient's wife called stating that the patient and herself have had nasal congestion for the last 4 days. She states that the patient is also experiencing a cough with deep breathing. She states that they have appointments scheduled and just wanted some advice for OTC medicaitons they can take for their symptoms. I have discussed home care instructions with her and she has no further questions or concerns. I advised her to call back for any new or worsening symptoms. She verbalized understanding and agreement with this plan.     Reason for Disposition  [1] Sinus congestion as part of a cold AND [2] present < 10 days  Answer Assessment - Initial Assessment Questions 1. LOCATION: "Where does it hurt?"      Sinuses  2. ONSET: "When did the sinus pain start?"  (e.g., hours, days)      4 days ago  3. SEVERITY: "How bad is the pain?"   (Scale 1-10; mild, moderate or severe)   - MILD (1-3): doesn't interfere with normal activities    - MODERATE (4-7): interferes with normal activities (e.g., work or school) or awakens from sleep   - SEVERE (8-10): excruciating pain and patient unable to do any normal activities        Mild  4. RECURRENT SYMPTOM: "Have you ever had sinus problems before?" If Yes, ask: "When was the last time?" and "What happened that time?"      Yes, happens yearly  5. NASAL CONGESTION: "Is the nose blocked?" If Yes,  ask: "Can you open it or must you breathe through your mouth?"     Yes  6. NASAL DISCHARGE: "Do you have discharge from your nose?" If so ask, "What color?"     Unsure  7. FEVER: "Do you have a fever?" If Yes, ask: "What is it, how was it measured, and when did it start?"      No 8. OTHER SYMPTOMS: "Do you have any other symptoms?" (e.g., sore throat, cough, earache, difficulty breathing)     Cough with deep breathing  Protocols used: Sinus Pain or Congestion-A-AH

## 2023-10-02 NOTE — Discharge Instructions (Addendum)
 Drink plenty of fluids.  Follow-up with your family doctor next week to recheck your kidney function and recheck your coughing.  Use inhaler every 4-6 hours as needed for shortness of breath

## 2023-10-02 NOTE — ED Provider Notes (Signed)
 [place] EMERGENCY DEPARTMENT AT [facility] Provider Note   CSN: [id number] Arrival date & time: 10/02/23  1538     History  Chief Complaint  Patient presents with   Cough   Shortness of Breath    Joseph Dillon is a 78 y.o. male.  Patient complains of shortness of breath and cough with mild yellow sputum production.  Patient has a history of hypertension   Cough Associated symptoms: shortness of breath   Shortness of Breath Associated symptoms: cough        Home Medications Prior to Admission medications   Medication Sig Start Date End Date Taking? Authorizing Provider  amLODipine (NORVASC) 10 MG tablet Take 1 tablet (10 mg total) by mouth daily. 06/24/23  Yes Donita Brooks, MD  aspirin EC 81 MG tablet Take 81 mg by mouth daily.   Yes [provider]  diphenhydrAMINE (BENADRYL) 25 MG tablet Take 25 mg by mouth at bedtime as needed for sleep.   Yes [provider]  doxazosin (CARDURA) 4 MG tablet TAKE 1 TABLET(4 MG) BY MOUTH DAILY Patient taking differently: Take 4 mg by mouth daily. 07/28/23  Yes Donita Brooks, MD  doxycycline (VIBRAMYCIN) 100 MG capsule Take 1 capsule (100 mg total) by mouth 2 (two) times daily. One po bid x 7 days 10/02/23  Yes Bethann Berkshire, MD  losartan (COZAAR) 100 MG tablet TAKE 1 TABLET(100 MG) BY MOUTH DAILY Patient taking differently: Take 100 mg by mouth daily. 06/09/23  Yes Donita Brooks, MD  Magnesium 250 MG TABS Take 250 mg by mouth daily.   Yes [provider]  Multiple Vitamins-Minerals (VITA-MIN PO) Take 1 tablet by mouth daily. One a day   Yes [provider]  Omega-3 Fatty Acids (FISH OIL) 1200 MG CAPS Take 1,200 mg by mouth daily.   Yes [provider]  pantoprazole (PROTONIX) 40 MG tablet Take 1 tablet (40 mg total) by mouth daily. 08/18/23  Yes Donita Brooks, MD  phenylephrine (SUDAFED PE) 10 MG TABS tablet Take 10 mg by mouth every 4 (four) hours as needed  (congestion).   Yes [provider]  rosuvastatin (CRESTOR) 20 MG tablet Take 1 tablet (20 mg total) by mouth daily. 08/18/23  Yes Donita Brooks, MD      Allergies    Cold medicine plus [chlorphen-pseudoephed-apap], Lisinopril, and Penicillins    Review of Systems   Review of Systems  Respiratory:  Positive for cough and shortness of breath.     Physical Exam Updated Vital Signs BP 121/75   Pulse 97   Temp 99.5 F (37.5 C) (Oral)   Resp 18   SpO2 93%  Physical Exam  ED Results / Procedures / Treatments   Labs (all labs ordered are listed, but only abnormal results are displayed) Labs Reviewed  CBC WITH DIFFERENTIAL/PLATELET - Abnormal; Notable for the following components:      Result Value   Hemoglobin 12.8 (*)    HCT 38.2 (*)    Monocytes Absolute 1.2 (*)    All other components within normal limits  COMPREHENSIVE METABOLIC PANEL WITH GFR - Abnormal; Notable for the following components:   Glucose, Bld 115 (*)    BUN 28 (*)    Creatinine, Ser 1.69 (*)    AST 55 (*)    ALT 88 (*)    Alkaline Phosphatase 254 (*)    GFR, Estimated 42 (*)    All other components within normal limits  RESP  PANEL BY RT-PCR (RSV, FLU A&B, COVID)  [id number]    EKG None  Radiology DG Chest Port 1 View Result Date: 10/02/2023 CLINICAL DATA:  Shortness of breath. EXAM: PORTABLE CHEST 1 VIEW COMPARISON:  January 14, 2019 FINDINGS: The heart size and mediastinal contours are within normal limits. Low lung volumes are noted with mild areas of linear scarring and/or atelectasis is seen within the bilateral lung bases. There is no evidence of focal consolidation, pleural effusion or pneumothorax. Multilevel degenerative changes are seen throughout the thoracic spine. IMPRESSION: Low lung volumes with mild bibasilar linear scarring and/or atelectasis. Electronically Signed   By: Aram Candela M.D.   On: 10/02/2023 22:28    Procedures Procedures    Medications Ordered in  ED Medications  albuterol (VENTOLIN HFA) 108 (90 Base) MCG/ACT inhaler 2 puff (has no administration in time range)  magnesium sulfate IVPB 2 g 50 mL (0 g Intravenous Stopped 10/02/23 2121)  methylPREDNISolone sodium succinate (SOLU-MEDROL) 125 mg/2 mL injection 125 mg (125 mg Intravenous Given 10/02/23 2018)  ipratropium-albuterol (DUONEB) 0.5-2.5 (3) MG/3ML nebulizer solution 3 mL (3 mLs Nebulization Given 10/02/23 2034)  albuterol (PROVENTIL) (2.5 MG/3ML) 0.083% nebulizer solution 2.5 mg (2.5 mg Nebulization Given 10/02/23 2034)    ED Course/ Medical Decision Making/ A&P                                 Medical Decision Making Amount and/or Complexity of Data Reviewed Labs: ordered. Radiology: ordered.  Risk Prescription drug management.   Patient with bronchitis and bronchospasm.  He is placed on an albuterol inhaler and doxycycline        Final Clinical Impression(s) / ED Diagnoses Final diagnoses:  Bronchitis  Bronchospasm  Dehydration    Rx / DC Orders ED Discharge Orders          Ordered    doxycycline (VIBRAMYCIN) 100 MG capsule  2 times daily        10/02/23 2259              Bethann Berkshire, MD 10/02/23 2301

## 2023-10-13 ENCOUNTER — Ambulatory Visit: Admitting: Family Medicine

## 2023-10-13 VITALS — BP 130/70 | HR 66 | Temp 98.0°F | Ht 67.0 in | Wt 198.0 lb

## 2023-10-13 DIAGNOSIS — R972 Elevated prostate specific antigen [PSA]: Secondary | ICD-10-CM | POA: Diagnosis not present

## 2023-10-13 DIAGNOSIS — R7989 Other specified abnormal findings of blood chemistry: Secondary | ICD-10-CM | POA: Diagnosis not present

## 2023-10-13 LAB — CBC WITH DIFFERENTIAL/PLATELET
Absolute Lymphocytes: 2248 {cells}/uL (ref 850–3900)
Absolute Monocytes: 752 {cells}/uL (ref 200–950)
Basophils Absolute: 80 {cells}/uL (ref 0–200)
Basophils Relative: 1 %
Eosinophils Absolute: 120 {cells}/uL (ref 15–500)
Eosinophils Relative: 1.5 %
HCT: 40.4 % (ref 38.5–50.0)
Hemoglobin: 13.1 g/dL — ABNORMAL LOW (ref 13.2–17.1)
MCH: 29.3 pg (ref 27.0–33.0)
MCHC: 32.4 g/dL (ref 32.0–36.0)
MCV: 90.4 fL (ref 80.0–100.0)
MPV: 10 fL (ref 7.5–12.5)
Monocytes Relative: 9.4 %
Neutro Abs: 4800 {cells}/uL (ref 1500–7800)
Neutrophils Relative %: 60 %
Platelets: 265 10*3/uL (ref 140–400)
RBC: 4.47 10*6/uL (ref 4.20–5.80)
RDW: 12.8 % (ref 11.0–15.0)
Total Lymphocyte: 28.1 %
WBC: 8 10*3/uL (ref 3.8–10.8)

## 2023-10-13 LAB — COMPLETE METABOLIC PANEL WITHOUT GFR
AG Ratio: 1.6 (calc) (ref 1.0–2.5)
ALT: 35 U/L (ref 9–46)
AST: 19 U/L (ref 10–35)
Albumin: 3.9 g/dL (ref 3.6–5.1)
Alkaline phosphatase (APISO): 100 U/L (ref 35–144)
BUN: 25 mg/dL (ref 7–25)
CO2: 30 mmol/L (ref 20–32)
Calcium: 9.6 mg/dL (ref 8.6–10.3)
Chloride: 104 mmol/L (ref 98–110)
Creat: 1.02 mg/dL (ref 0.70–1.28)
Globulin: 2.5 g/dL (ref 1.9–3.7)
Glucose, Bld: 128 mg/dL — ABNORMAL HIGH (ref 65–99)
Potassium: 4.3 mmol/L (ref 3.5–5.3)
Sodium: 140 mmol/L (ref 135–146)
Total Bilirubin: 0.4 mg/dL (ref 0.2–1.2)
Total Protein: 6.4 g/dL (ref 6.1–8.1)

## 2023-10-13 LAB — PSA: PSA: 8.4 ng/mL — ABNORMAL HIGH (ref <=4.00)
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
## 2023-10-26 ENCOUNTER — Other Ambulatory Visit: Payer: Self-pay | Admitting: Family Medicine

## 2023-10-28 NOTE — Telephone Encounter (Signed)
 Requested Prescriptions  Pending Prescriptions Disp Refills   doxazosin  (CARDURA ) 4 MG tablet [Pharmacy Med Name: DOXAZOSIN  4MG  TABLETS] 90 tablet 0    Sig: TAKE 1 TABLET(4 MG) BY MOUTH DAILY     Cardiovascular:  Alpha Blockers Failed - 10/28/2023 12:29 PM      Failed - Valid encounter within last 6 months    Recent Outpatient Visits           2 weeks ago Elevated PSA   [place] [facility] Austine Lefort, MD   1 month ago Prostate cancer screening   [place] [facility] Austine Lefort, MD   2 months ago Hypokalemia   [place] [facility] Cheril Cork, Cisco Crest, MD   1 year ago Essential hypertension   [place] [facility] Austine Lefort, MD              Passed - Last BP in normal range    BP Readings from Last 1 Encounters:  10/13/23 130/70

## 2023-11-05 ENCOUNTER — Ambulatory Visit

## 2023-11-15 ENCOUNTER — Other Ambulatory Visit: Payer: Self-pay | Admitting: Family Medicine

## 2023-11-19 NOTE — Telephone Encounter (Signed)
 Requested medication (s) are due for refill today: YES  Requested medication (s) are on the active medication list: YES  Last refill:  08/18/23 #90  Future visit scheduled: NO  Notes to clinic:  OVERDUE lab work    Requested Prescriptions  Pending Prescriptions Disp Refills   rosuvastatin  (CRESTOR ) 20 MG tablet [Pharmacy Med Name: ROSUVASTATIN  20MG  TABLETS] 90 tablet 0    Sig: TAKE 1 TABLET(20 MG) BY MOUTH DAILY     Cardiovascular:  Antilipid - Statins 2 Failed - 11/19/2023  8:03 AM      Failed - Valid encounter within last 12 months    Recent Outpatient Visits           1 month ago Elevated PSA   [place] [facility] Austine Lefort, MD   2 months ago Prostate cancer screening   [place] [facility] Austine Lefort, MD   3 months ago Hypokalemia   [place] [facility] Pickard, Cisco Crest, MD   1 year ago Essential hypertension   [place] [facility] Austine Lefort, MD              Failed - Lipid Panel in normal range within the last 12 months    Cholesterol  Date Value Ref Range Status  08/23/2022 141 <200 mg/dL Final   LDL Cholesterol (Calc)  Date Value Ref Range Status  08/23/2022 67 mg/dL (calc) Final    Comment:    Reference range: <100 . Desirable range <100 mg/dL for primary prevention;   <70 mg/dL for patients with CHD or diabetic patients  with > or = 2 CHD risk factors. Aaron Aas LDL-C is now calculated using the Martin-Hopkins  calculation, which is a validated novel method providing  better accuracy than the Friedewald equation in the  estimation of LDL-C.  Melinda Sprawls et al. Erroll Heard. 1610;960(45): 2061-2068  (http://education.[facility].com/faq/FAQ164)    HDL  Date Value Ref Range Status  08/23/2022 61 > OR = 40 mg/dL Final   Triglycerides  Date Value Ref Range Status  08/23/2022 58 <150 mg/dL Final         Passed - Cr in normal range and within 360 days     Creat  Date Value Ref Range Status  10/13/2023 1.02 0.70 - 1.28 mg/dL Final         Passed - Patient is not pregnant

## 2023-12-04 ENCOUNTER — Other Ambulatory Visit: Payer: Self-pay | Admitting: Family Medicine

## 2024-01-26 ENCOUNTER — Other Ambulatory Visit: Payer: Self-pay | Admitting: Family Medicine

## 2024-02-12 ENCOUNTER — Other Ambulatory Visit: Payer: Self-pay | Admitting: Family Medicine

## 2024-02-13 NOTE — Telephone Encounter (Signed)
 Requested Prescriptions  Pending Prescriptions Disp Refills   amLODipine  (NORVASC ) 10 MG tablet [Pharmacy Med Name: AMLODIPINE  BESYLATE 10MG TABLETS] 90 tablet 0    Sig: TAKE 1 TABLET(10 MG) BY MOUTH DAILY     Cardiovascular: Calcium  Channel Blockers 2 Passed - 02/13/2024  2:19 PM      Passed - Last BP in normal range    BP Readings from Last 1 Encounters:  10/13/23 130/70         Passed - Last Heart Rate in normal range    Pulse Readings from Last 1 Encounters:  10/13/23 66         Passed - Valid encounter within last 6 months    Recent Outpatient Visits           4 months ago Elevated PSA   [facility] Duanne Butler [name], MD   5 months ago Prostate cancer screening   [place] [facility] Duanne Butler [name], MD   6 months ago Hypokalemia   [facility] Duanne, Butler [name], MD   1 year ago Essential hypertension   [place] [facility] Pickard, Butler [name], MD

## 2024-02-14 ENCOUNTER — Other Ambulatory Visit: Payer: Self-pay | Admitting: Family Medicine

## 2024-02-16 ENCOUNTER — Other Ambulatory Visit: Payer: Self-pay | Admitting: Family Medicine

## 2024-02-16 NOTE — Telephone Encounter (Signed)
 Requested Prescriptions  Pending Prescriptions Disp Refills   rosuvastatin  (CRESTOR ) 20 MG tablet [Pharmacy Med Name: ROSUVASTATIN  20MG  TABLETS] 90 tablet 0    Sig: TAKE 1 TABLET(20 MG) BY MOUTH DAILY     Cardiovascular:  Antilipid - Statins 2 Failed - 02/16/2024  1:36 PM      Failed - Lipid Panel in normal range within the last 12 months    Cholesterol  Date Value Ref Range Status  08/23/2022 141 <200 mg/dL Final   LDL Cholesterol (Calc)  Date Value Ref Range Status  08/23/2022 67 mg/dL (calc) Final    Comment:    Reference range: <100 . Desirable range <100 mg/dL for primary prevention;   <70 mg/dL for patients with CHD or diabetic patients  with > or = 2 CHD risk factors. SABRA LDL-C is now calculated using the Martin-Hopkins  calculation, which is a validated novel method providing  better accuracy than the Friedewald equation in the  estimation of LDL-C.  Gladis APPLETHWAITE et al. SANDREA. 7986;689(80): 2061-2068  (http://education.[facility].com/faq/FAQ164)    HDL  Date Value Ref Range Status  08/23/2022 61 > OR = 40 mg/dL Final   Triglycerides  Date Value Ref Range Status  08/23/2022 58 <150 mg/dL Final         Passed - Cr in normal range and within 360 days    Creat  Date Value Ref Range Status  10/13/2023 1.02 0.70 - 1.28 mg/dL Final         Passed - Patient is not pregnant      Passed - Valid encounter within last 12 months    Recent Outpatient Visits           4 months ago Elevated PSA   [place] [facility] Pickard, Butler [name], MD   5 months ago Prostate cancer screening   [place] [facility] Duanne Butler [name], MD   6 months ago Hypokalemia   [place] [facility] Duanne, Butler [name], MD   1 year ago Essential hypertension   [place] [facility] Duanne, Butler [name], MD               pantoprazole  (PROTONIX ) 40 MG tablet [Pharmacy Med Name: PANTOPRAZOLE  40MG  TABLETS] 90 tablet 1     Sig: TAKE 1 TABLET(40 MG) BY MOUTH DAILY     Gastroenterology: Proton Pump Inhibitors Passed - 02/16/2024  1:36 PM      Passed - Valid encounter within last 12 months    Recent Outpatient Visits           4 months ago Elevated PSA   [place] [facility] Duanne Butler [name], MD   5 months ago Prostate cancer screening   [place] [facility] Duanne Butler [name], MD   6 months ago Hypokalemia   [place] [facility] Pickard, Butler [name], MD   1 year ago Essential hypertension   [place] [facility] Pickard, Butler [name], MD

## 2024-02-16 NOTE — Telephone Encounter (Signed)
 Requested medications are due for refill today.  yes  Requested medications are on the active medications list.  yes  Last refill. 11/19/2023 #90 0 rf  Future visit scheduled.   no  Notes to clinic.  Labs are expired.    Requested Prescriptions  Pending Prescriptions Disp Refills   rosuvastatin  (CRESTOR ) 20 MG tablet [Pharmacy Med Name: ROSUVASTATIN  20MG  TABLETS] 90 tablet 0    Sig: TAKE 1 TABLET(20 MG) BY MOUTH DAILY     Cardiovascular:  Antilipid - Statins 2 Failed - 02/16/2024  1:36 PM      Failed - Lipid Panel in normal range within the last 12 months    Cholesterol  Date Value Ref Range Status  08/23/2022 141 <200 mg/dL Final   LDL Cholesterol (Calc)  Date Value Ref Range Status  08/23/2022 67 mg/dL (calc) Final    Comment:    Reference range: <100 . Desirable range <100 mg/dL for primary prevention;   <70 mg/dL for patients with CHD or diabetic patients  with > or = 2 CHD risk factors. SABRA LDL-C is now calculated using the Martin-Hopkins  calculation, which is a validated novel method providing  better accuracy than the Friedewald equation in the  estimation of LDL-C.  Gladis APPLETHWAITE et al. SANDREA. 7986;689(80): 2061-2068  (http://education.[facility].com/faq/FAQ164)    HDL  Date Value Ref Range Status  08/23/2022 61 > OR = 40 mg/dL Final   Triglycerides  Date Value Ref Range Status  08/23/2022 58 <150 mg/dL Final         Passed - Cr in normal range and within 360 days    Creat  Date Value Ref Range Status  10/13/2023 1.02 0.70 - 1.28 mg/dL Final         Passed - Patient is not pregnant      Passed - Valid encounter within last 12 months    Recent Outpatient Visits           4 months ago Elevated PSA   [place] [facility] Pickard, Butler [name], MD   5 months ago Prostate cancer screening   [place] [facility] Duanne Butler [name], MD   6 months ago Hypokalemia   [place] [facility] Duanne,  Butler [name], MD   1 year ago Essential hypertension   [place] [facility] Duanne Butler [name], MD              Signed Prescriptions Disp Refills   pantoprazole  (PROTONIX ) 40 MG tablet 90 tablet 1    Sig: TAKE 1 TABLET(40 MG) BY MOUTH DAILY     Gastroenterology: Proton Pump Inhibitors Passed - 02/16/2024  1:36 PM      Passed - Valid encounter within last 12 months    Recent Outpatient Visits           4 months ago Elevated PSA   [place] [facility] Duanne Butler [name], MD   5 months ago Prostate cancer screening   [facility] Duanne Butler [name], MD   6 months ago Hypokalemia   [place] [facility] Pickard, Butler [name], MD   1 year ago Essential hypertension   [place] [facility] Pickard, Butler [name], MD

## 2024-02-17 NOTE — Telephone Encounter (Signed)
 Requested Prescriptions  Pending Prescriptions Disp Refills   rosuvastatin  (CRESTOR ) 20 MG tablet [Pharmacy Med Name: ROSUVASTATIN  20MG  TABLETS] 90 tablet 0    Sig: TAKE 1 TABLET(20 MG) BY MOUTH DAILY     There is no refill protocol information for this order

## 2024-03-01 ENCOUNTER — Other Ambulatory Visit: Payer: Self-pay | Admitting: Family Medicine

## 2024-03-02 NOTE — Telephone Encounter (Signed)
 Requested Prescriptions  Pending Prescriptions Disp Refills   losartan  (COZAAR ) 100 MG tablet [Pharmacy Med Name: LOSARTAN  100MG  TABLETS] 90 tablet 0    Sig: TAKE 1 TABLET(100 MG) BY MOUTH DAILY     Cardiovascular:  Angiotensin Receptor Blockers Passed - 03/02/2024 10:25 AM      Passed - Cr in normal range and within 180 days    Creat  Date Value Ref Range Status  10/13/2023 1.02 0.70 - 1.28 mg/dL Final         Passed - K in normal range and within 180 days    Potassium  Date Value Ref Range Status  10/13/2023 4.3 3.5 - 5.3 mmol/L Final         Passed - Patient is not pregnant      Passed - Last BP in normal range    BP Readings from Last 1 Encounters:  10/13/23 130/70         Passed - Valid encounter within last 6 months    Recent Outpatient Visits           4 months ago Elevated PSA   [place] [facility] Duanne Butler [name], MD   5 months ago Prostate cancer screening   [place] [facility] Duanne Butler [name], MD   7 months ago Hypokalemia   [place] [facility] Pickard, Butler [name], MD   1 year ago Essential hypertension   [place] [facility] Pickard, Butler [name], MD

## 2024-03-05 ENCOUNTER — Ambulatory Visit: Payer: Self-pay

## 2024-03-05 ENCOUNTER — Other Ambulatory Visit: Payer: Self-pay | Admitting: Family Medicine

## 2024-03-05 NOTE — Telephone Encounter (Signed)
 FYI Only or Action Required?: Action required by provider: Medication Request.  Patient was last seen in primary care on 10/13/2023 by Duanne Butler [name], MD.  Called Nurse Triage reporting Cough.  Symptoms began several weeks ago.  Interventions attempted: OTC medications: Nyquil and Rest, hydration, or home remedies.  Symptoms are: unchanged.  Triage Disposition: Home Care  Patient/caregiver understands and will follow disposition?: Yes Reason for Disposition  Cough with cold symptoms (e.g., runny nose, postnasal drip, throat clearing)  Answer Assessment - Initial Assessment Questions NyQuil to help sleep, but not helping with symptoms. Stated this happened last spring and was given an antibiotic and would like for that to be called in to [employer] on file. Call back number: [phone]  Also asked about doxazosin  (CARDURA ) 4 MG, advised patient the refill request came in at about noon, refills can take up to 3 days, but it was sent to the office.   1. ONSET: When did the cough begin?      2 weeks ago   2. SEVERITY: How bad is the cough today?      Got it pretty good, post nasal drip and coughing   3. SPUTUM: Describe the color of your sputum (e.g., none, dry cough; clear, white, yellow, green)     Yellow  4. DIFFICULTY BREATHING: Are you having difficulty breathing? If Yes, ask: How bad is it? (e.g., mild, moderate, severe)      No, experiences some wheezing from coughing, uses inhaler  5. FEVER: Do you have a fever? If Yes, ask: What is your temperature, how was it measured, and when did it start?     No  6. OTHER SYMPTOMS: Do you have any other symptoms? (e.g., runny nose, wheezing, chest pain)       Sore throat, headaches, chest pain from coughing  Protocols used: Cough - Acute Productive-A-AH  Copied from CRM #[id number]. 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 He states it has come back and would like for it to be prescribed agai

## 2024-03-05 NOTE — Telephone Encounter (Signed)
 Copied from CRM [id number]. Topic: Clinical - Medication Refill >> Mar 05, 2024 11:37 AM Debby BROCKS wrote: Medication: doxazosin  (CARDURA ) 4 MG tablet  Has the patient contacted their pharmacy? Yes (Agent: If no, request that the patient contact the pharmacy for the refill. If patient does not wish to contact the pharmacy document the reason why and proceed with request.) (Agent: If yes, when and what did the pharmacy advise?) Out of refills and pharmacy sent over request as well  This is the patient's preferred pharmacy:  [id number] - [place], [place] - [address] [place] [address] Phone: [phone] Fax: [phone]  Is this the correct pharmacy for this prescription? Yes If no, delete pharmacy and type the correct one.   Has the prescription been filled recently? No  Is the patient out of the medication? Yes  Has the patient been seen for an appointment in the last year OR does the patient have an upcoming appointment? Yes  Can we respond through MyChart? No, Phone call  Agent: Please be advised that Rx refills may take up to 3 business days. We ask that you follow-up with your pharmacy.

## 2024-03-05 NOTE — Telephone Encounter (Signed)
 1st attempt, no answer on patient's mobile number and no voicemail box set up. Called and left a message on patient's home phone # to return call for nurse triage.       Copied from CRM #[id number]. 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 He states it has come back and would like for it to be prescribed again

## 2024-03-26 ENCOUNTER — Other Ambulatory Visit: Payer: Self-pay | Admitting: Family Medicine

## 2024-03-26 NOTE — Telephone Encounter (Signed)
 Copied from CRM [id number]. Topic: Clinical - Medication Refill >> Mar 26, 2024  8:02 AM Larissa S wrote: Medication: doxazosin  (CARDURA ) 4 MG tablet rosuvastatin  (CRESTOR ) 20 MG tablet  Has the patient contacted their pharmacy? Yes (Agent: If no, request that the patient contact the pharmacy for the refill. If patient does not wish to contact the pharmacy document the reason why and proceed with request.) (Agent: If yes, when and what did the pharmacy advise?)  This is the patient's preferred pharmacy:  [id number] - [place], [place] - [address] [place] [address] Phone: [phone] Fax: [phone]    Is this the correct pharmacy for this prescription? Yes If no, delete pharmacy and type the correct one.   Has the prescription been filled recently? No  Is the patient out of the medication? Yes  Has the patient been seen for an appointment in the last year OR does the patient have an upcoming appointment? Yes  Can we respond through MyChart? No  Agent: Please be advised that Rx refills may take up to 3 business days. We ask that you follow-up with your pharmacy.

## 2024-03-26 NOTE — Telephone Encounter (Signed)
 Too soon for refill, LRF 01/26/24 FOR 90 days.  Requested Prescriptions  Pending Prescriptions Disp Refills   doxazosin  (CARDURA ) 4 MG tablet 90 tablet 0     Cardiovascular:  Alpha Blockers Failed - 03/26/2024  4:22 PM      Failed - Valid encounter within last 6 months    Recent Outpatient Visits           5 months ago Elevated PSA   [place] [facility] Duanne, Butler [name], MD   6 months ago Prostate cancer screening   [place] [facility] Duanne Butler [name], MD   7 months ago Hypokalemia   [place] [facility] Duanne Butler [name], MD   1 year ago Essential hypertension   [place] [facility] Duanne, Butler [name], MD              Passed - Last BP in normal range    BP Readings from Last 1 Encounters:  10/13/23 130/70          rosuvastatin  (CRESTOR ) 20 MG tablet 90 tablet 0     Cardiovascular:  Antilipid - Statins 2 Failed - 03/26/2024  4:22 PM      Failed - Lipid Panel in normal range within the last 12 months    Cholesterol  Date Value Ref Range Status  08/23/2022 141 <200 mg/dL Final   LDL Cholesterol (Calc)  Date Value Ref Range Status  08/23/2022 67 mg/dL (calc) Final    Comment:    Reference range: <100 . Desirable range <100 mg/dL for primary prevention;   <70 mg/dL for patients with CHD or diabetic patients  with > or = 2 CHD risk factors. SABRA LDL-C is now calculated using the Martin-Hopkins  calculation, which is a validated novel method providing  better accuracy than the Friedewald equation in the  estimation of LDL-C.  Gladis APPLETHWAITE et al. SANDREA. 7986;689(80): 2061-2068  (http://education.[facility].com/faq/FAQ164)    HDL  Date Value Ref Range Status  08/23/2022 61 > OR = 40 mg/dL Final   Triglycerides  Date Value Ref Range Status  08/23/2022 58 <150 mg/dL Final         Passed - Cr in normal range and within 360 days    Creat  Date Value Ref Range Status  10/13/2023  1.02 0.70 - 1.28 mg/dL Final         Passed - Patient is not pregnant      Passed - Valid encounter within last 12 months    Recent Outpatient Visits           5 months ago Elevated PSA   [place] [facility] Duanne Butler [name], MD   6 months ago Prostate cancer screening   [place] [facility] Duanne Butler [name], MD   7 months ago Hypokalemia   [place] [facility] Pickard, Butler [name], MD   1 year ago Essential hypertension   [place] [facility] Pickard, Butler [name], MD

## 2024-04-05 ENCOUNTER — Ambulatory Visit: Admitting: Family Medicine

## 2024-04-05 ENCOUNTER — Encounter: Payer: Self-pay | Admitting: Family Medicine

## 2024-04-05 VITALS — BP 130/84 | HR 83 | Temp 98.7°F | Ht 67.0 in | Wt 198.4 lb

## 2024-04-05 DIAGNOSIS — B9689 Other specified bacterial agents as the cause of diseases classified elsewhere: Secondary | ICD-10-CM

## 2024-04-05 DIAGNOSIS — J019 Acute sinusitis, unspecified: Secondary | ICD-10-CM

## 2024-04-05 DIAGNOSIS — M25511 Pain in right shoulder: Secondary | ICD-10-CM

## 2024-04-05 DIAGNOSIS — G8929 Other chronic pain: Secondary | ICD-10-CM

## 2024-04-05 DIAGNOSIS — M25512 Pain in left shoulder: Secondary | ICD-10-CM | POA: Diagnosis not present

## 2024-04-05 MED ORDER — CEFDINIR 300 MG PO CAPS: 300.0000 mg | ORAL_CAPSULE | Freq: Two times a day (BID) | ORAL | 0 refills | Status: DC

## 2024-04-05 MED ORDER — ROSUVASTATIN CALCIUM 20 MG PO TABS: 20.0000 mg | ORAL_TABLET | Freq: Every day | ORAL | 1 refills | Status: AC

## 2024-04-05 MED ORDER — DOXAZOSIN MESYLATE 4 MG PO TABS: 4.0000 mg | ORAL_TABLET | Freq: Every day | ORAL | 1 refills | Status: AC
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
## 2024-04-07 ENCOUNTER — Ambulatory Visit (HOSPITAL_COMMUNITY)
Admission: RE | Admit: 2024-04-07 | Discharge: 2024-04-07 | Disposition: A | Discharge status: Home / Self Care | Source: Ambulatory Visit | Attending: Family Medicine | Admitting: Family Medicine

## 2024-04-07 DIAGNOSIS — M25511 Pain in right shoulder: Secondary | ICD-10-CM | POA: Diagnosis not present

## 2024-04-07 DIAGNOSIS — G8929 Other chronic pain: Secondary | ICD-10-CM | POA: Insufficient documentation

## 2024-04-07 DIAGNOSIS — M25512 Pain in left shoulder: Secondary | ICD-10-CM | POA: Diagnosis not present

## 2024-04-07 DIAGNOSIS — M19011 Primary osteoarthritis, right shoulder: Secondary | ICD-10-CM | POA: Diagnosis not present

## 2024-04-07 DIAGNOSIS — M19012 Primary osteoarthritis, left shoulder: Secondary | ICD-10-CM | POA: Diagnosis not present

## 2024-04-08 ENCOUNTER — Other Ambulatory Visit: Payer: Self-pay

## 2024-04-08 ENCOUNTER — Telehealth: Payer: Self-pay

## 2024-04-08 ENCOUNTER — Ambulatory Visit: Payer: Self-pay | Admitting: Family Medicine

## 2024-04-08 DIAGNOSIS — G8929 Other chronic pain: Secondary | ICD-10-CM

## 2024-04-08 NOTE — Telephone Encounter (Signed)
 Pt informed of results. Referral sent to [facility].

## 2024-04-08 NOTE — Progress Notes (Signed)
Pt informed referral sent

## 2024-04-08 NOTE — Telephone Encounter (Signed)
 Copied from CRM [id number]. Topic: Referral - Request for Referral >> Apr 08, 2024 11:58 AM Joesph NOVAK wrote: Did the patient discuss referral with their provider in the last year? Yes (If No - schedule appointment) (If Yes - send message)  Appointment offered? No  Type of order/referral and detailed reason for visit: Orthopedic Consult   Preference of office, provider, location: No pref  If referral order, have you been seen by this specialty before? No (If Yes, this issue or another issue? When? Where?  Can we respond through MyChart? Yes

## 2024-04-19 ENCOUNTER — Other Ambulatory Visit: Payer: Self-pay | Admitting: Family Medicine

## 2024-04-20 NOTE — Telephone Encounter (Signed)
 Requested medication (s) are due for refill today: na   Requested medication (s) are on the active medication list: yes   Last refill:  04/05/24 #20 0 refills  Future visit scheduled: no  Notes to clinic:  medication not assigned to a protocol. Do you want to refill Rx?     Requested Prescriptions  Pending Prescriptions Disp Refills   cefdinir  (OMNICEF ) 300 MG capsule [Pharmacy Med Name: CEFDINIR  300MG  CAPSULES] 20 capsule 0    Sig: TAKE 1 CAPSULE(300 MG) BY MOUTH TWICE DAILY     Off-Protocol Failed - 04/20/2024  2:12 PM      Failed - Medication not assigned to a protocol, review manually.      Passed - Valid encounter within last 12 months    Recent Outpatient Visits           2 weeks ago Chronic pain of both shoulders   [place] [facility] Pickard, Butler [name], MD   6 months ago Elevated PSA   [place] [facility] Duanne, Butler [name], MD   7 months ago Prostate cancer screening   [place] Duanne Butler [name], MD   8 months ago Hypokalemia   [facility] Pickard, Butler [name], MD   1 year ago Essential hypertension    [place] [facility] Pickard, Butler [name], MD

## 2024-05-05 ENCOUNTER — Ambulatory Visit: Admitting: Orthopedic Surgery

## 2024-05-05 ENCOUNTER — Other Ambulatory Visit: Payer: Self-pay

## 2024-05-05 DIAGNOSIS — M25511 Pain in right shoulder: Secondary | ICD-10-CM | POA: Diagnosis not present

## 2024-05-05 DIAGNOSIS — M19011 Primary osteoarthritis, right shoulder: Secondary | ICD-10-CM | POA: Diagnosis not present
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
## 2024-05-08 ENCOUNTER — Encounter: Payer: Self-pay | Admitting: Orthopedic Surgery

## 2024-05-08 MED ORDER — TRIAMCINOLONE ACETONIDE 40 MG/ML IJ SUSP
40.0000 mg | INTRAMUSCULAR | Status: AC | PRN
  Administered 2024-05-05: 40 mg via INTRA_ARTICULAR

## 2024-05-08 MED ORDER — LIDOCAINE HCL 1 % IJ SOLN
5.0000 mL | INTRAMUSCULAR | Status: AC | PRN
  Administered 2024-05-05: 5 mL

## 2024-05-10 ENCOUNTER — Ambulatory Visit: Admitting: Orthopedic Surgery

## 2024-05-15 ENCOUNTER — Other Ambulatory Visit: Payer: Self-pay | Admitting: Family Medicine

## 2024-05-17 NOTE — Telephone Encounter (Signed)
 Requested Prescriptions  Pending Prescriptions Disp Refills   amLODipine  (NORVASC ) 10 MG tablet [Pharmacy Med Name: AMLODIPINE  BESYLATE 10MG TABLETS] 90 tablet 0    Sig: TAKE 1 TABLET(10 MG) BY MOUTH DAILY     Cardiovascular: Calcium  Channel Blockers 2 Passed - 05/17/2024 12:06 PM      Passed - Last BP in normal range    BP Readings from Last 1 Encounters:  04/05/24 130/84         Passed - Last Heart Rate in normal range    Pulse Readings from Last 1 Encounters:  04/05/24 83         Passed - Valid encounter within last 6 months    Recent Outpatient Visits           1 month ago Chronic pain of both shoulders   [facility] Duanne, Butler [name], MD   7 months ago Elevated PSA   [place] [facility] Duanne Butler [name], MD   8 months ago Prostate cancer screening   [facility] Duanne Butler [name], MD   9 months ago Hypokalemia   [facility] Pickard, Butler [name], MD   1 year ago Essential hypertension   [facility] [facility] Pickard, Butler [name], MD

## 2024-05-30 ENCOUNTER — Other Ambulatory Visit: Payer: Self-pay | Admitting: Family Medicine

## 2024-06-03 ENCOUNTER — Other Ambulatory Visit: Payer: Self-pay | Admitting: Family Medicine

## 2024-07-12 ENCOUNTER — Other Ambulatory Visit: Payer: Self-pay

## 2024-07-12 ENCOUNTER — Encounter (HOSPITAL_COMMUNITY): Payer: Self-pay

## 2024-07-12 ENCOUNTER — Emergency Department (HOSPITAL_COMMUNITY): Admission: EM | Admit: 2024-07-12 | Discharge: 2024-07-12 | Disposition: A | Discharge status: Home / Self Care

## 2024-07-12 DIAGNOSIS — Z7982 Long term (current) use of aspirin: Secondary | ICD-10-CM | POA: Diagnosis not present

## 2024-07-12 DIAGNOSIS — R7989 Other specified abnormal findings of blood chemistry: Secondary | ICD-10-CM | POA: Diagnosis not present

## 2024-07-12 DIAGNOSIS — M79601 Pain in right arm: Secondary | ICD-10-CM | POA: Diagnosis present

## 2024-07-12 LAB — D-DIMER, QUANTITATIVE: D-Dimer, Quant: 0.99 ug{FEU}/mL — ABNORMAL HIGH (ref 0.00–0.50)

## 2024-07-12 MED ORDER — APIXABAN 5 MG PO TABS
10.0000 mg | ORAL_TABLET | Freq: Once | ORAL | Status: AC
  Administered 2024-07-12: 10 mg via ORAL
  Filled 2024-07-12: qty 2

## 2024-07-12 NOTE — Discharge Instructions (Signed)
 Return tomorrow morning for your DVT study.

## 2024-07-12 NOTE — ED Triage Notes (Signed)
 Pt to ED with c/o right arm pain that started about 2-3 weeks ago, pt says he had a large bruise to this arm after moving refrigerator couple weeks ago, pain has then moved up and down arm, pt says he is pretty sure it is a blood clot

## 2024-07-12 NOTE — ED Provider Notes (Signed)
 "  EMERGENCY DEPARTMENT AT [facility] Provider Note   CSN: [id number] Arrival date & time: 07/12/24  2224     Patient presents with: Arm Pain   Joseph Dillon is a 79 y.o. male.   79 year old male presents for evaluation of right arm pain. He is concerned for a blood clot. States a few weeks ago he dropped a refrigerator on his bicep while he was trying to move it. States he had a large bruise that has resolved at this time. States the pain is moving up and down his arm. States today the pain is in his forearm. Denies any other symptoms or concerns.    Arm Pain Pertinent negatives include no chest pain, no abdominal pain and no shortness of breath.       Prior to Admission medications  Medication Sig Start Date End Date Taking? Authorizing Provider  amLODipine  (NORVASC ) 10 MG tablet TAKE 1 TABLET(10 MG) BY MOUTH DAILY 05/17/24   Duanne Butler [name], MD  aspirin EC 81 MG tablet Take 81 mg by mouth daily. Patient not taking: Reported on 04/05/2024    [provider]  cefdinir  (OMNICEF ) 300 MG capsule TAKE 1 CAPSULE(300 MG) BY MOUTH TWICE DAILY 04/22/24   Duanne Butler [name], MD  diphenhydrAMINE (BENADRYL) 25 MG tablet Take 25 mg by mouth at bedtime as needed for sleep. Patient not taking: Reported on 04/05/2024    [provider]  doxazosin  (CARDURA ) 4 MG tablet Take 1 tablet (4 mg total) by mouth daily. 04/05/24   Duanne Butler [name], MD  doxycycline  (VIBRAMYCIN ) 100 MG capsule Take 1 capsule (100 mg total) by mouth 2 (two) times daily. One po bid x 7 days Patient not taking: Reported on 04/05/2024 10/02/23   Zammit, Joseph, MD  losartan  (COZAAR ) 100 MG tablet TAKE 1 TABLET(100 MG) BY MOUTH DAILY 05/31/24   Duanne Butler [name], MD  Magnesium  250 MG TABS Take 250 mg by mouth daily. Patient not taking: Reported on 04/05/2024    [provider]  Multiple Vitamins-Minerals (VITA-MIN PO) Take 1 tablet by mouth daily. One a day Patient not taking:  Reported on 04/05/2024    [provider]  Omega-3 Fatty Acids (FISH OIL) 1200 MG CAPS Take 1,200 mg by mouth daily. Patient not taking: Reported on 04/05/2024    [provider]  pantoprazole  (PROTONIX ) 40 MG tablet TAKE 1 TABLET(40 MG) BY MOUTH DAILY 02/16/24   Duanne Butler [name], MD  phenylephrine (SUDAFED PE) 10 MG TABS tablet Take 10 mg by mouth every 4 (four) hours as needed (congestion). Patient not taking: Reported on 04/05/2024    [provider]  rosuvastatin  (CRESTOR ) 20 MG tablet Take 1 tablet (20 mg total) by mouth daily. 04/05/24   Duanne Butler [name], MD    Allergies: Cold medicine plus [chlorphen-pseudoephed-apap], Lisinopril, and Penicillins    Review of Systems  Constitutional:  Negative for chills and fever.  HENT:  Negative for ear pain and sore throat.   Eyes:  Negative for pain and visual disturbance.  Respiratory:  Negative for cough and shortness of breath.   Cardiovascular:  Negative for chest pain and palpitations.  Gastrointestinal:  Negative for abdominal pain and vomiting.  Genitourinary:  Negative for dysuria and hematuria.  Musculoskeletal:  Negative for arthralgias and back pain.       Admits right arm pain   Skin:  Negative for color change and rash.  Neurological:  Negative for seizures and syncope.  All other systems  reviewed and are negative.   Updated Vital Signs BP (!) 159/89 (BP Location: Left Arm)   Pulse 88   Temp 98.8 F (37.1 C) (Oral)   Resp 16   Ht 5' 7 (1.702 m)   Wt 92.1 kg   SpO2 97%   BMI 31.79 kg/m   Physical Exam Vitals and nursing note reviewed.  Constitutional:      General: He is not in acute distress.    Appearance: Normal appearance. He is well-developed. He is not ill-appearing.  HENT:     Head: Normocephalic and atraumatic.  Eyes:     Conjunctiva/sclera: Conjunctivae normal.  Cardiovascular:     Rate and Rhythm: Normal rate and regular rhythm.     Heart sounds: No murmur  heard. Pulmonary:     Effort: Pulmonary effort is normal. No respiratory distress.     Breath sounds: Normal breath sounds.  Abdominal:     Palpations: Abdomen is soft.     Tenderness: There is no abdominal tenderness.  Musculoskeletal:        General: No swelling.     Cervical back: Neck supple.     Comments: Mild ttp of right forearm. No swelling, no ecchymosis   Skin:    General: Skin is warm and dry.     Capillary Refill: Capillary refill takes less than 2 seconds.  Neurological:     General: No focal deficit present.     Mental Status: He is alert.  Psychiatric:        Mood and Affect: Mood normal.     (all labs ordered are listed, but only abnormal results are displayed) Labs Reviewed  D-DIMER, QUANTITATIVE - Abnormal; Notable for the following components:      Result Value   D-Dimer, Quant 0.99 (*)    All other components within normal limits    EKG: None  Radiology: No results found.   Procedures   Medications Ordered in the ED  apixaban  (ELIQUIS ) tablet 10 mg (has no administration in time range)                                    Medical Decision Making Patient here for arm pain and he is worried he has a blood clot.  D-dimer is positive.  Will give him 1 dose of Eliquis  here and I ordered an ultrasound for him to come back to get tomorrow.  He feels comfortable to plan to be discharged.  Advised Tylenol  as needed for pain.  Advised return for any other new or worsening symptoms.  Problems Addressed: Positive D dimer: undiagnosed new problem with uncertain prognosis Right arm pain: undiagnosed new problem with uncertain prognosis  Amount and/or Complexity of Data Reviewed External Data Reviewed: notes.    Details: Prior ED records reviewed and patient seen for extended 25 for bronchitis Labs: ordered. Decision-making details documented in ED Course.    Details: Ordered and reviewed by me and patient has a positive D-dimer  Risk OTC  drugs. Prescription drug management.      Final diagnoses:  Right arm pain  Positive D dimer    ED Discharge Orders          Ordered    US  Venous Img Upper Uni Right        07/12/24 2331               Gennaro Duwaine CROME, DO 07/12/24 2332  "

## 2024-07-13 ENCOUNTER — Ambulatory Visit (HOSPITAL_COMMUNITY)
Admission: RE | Admit: 2024-07-13 | Discharge: 2024-07-13 | Disposition: A | Discharge status: Home / Self Care | Source: Ambulatory Visit

## 2024-07-13 DIAGNOSIS — M79601 Pain in right arm: Secondary | ICD-10-CM | POA: Diagnosis present

## 2024-07-13 NOTE — ED Provider Notes (Signed)
" ° °  Patient patient seen yesterday for pain right upper arm, returned today for scheduled outpatient venous imaging of the right upper extremity.  Ultrasound negative for evidence of DVT.  Patient reports area remains tender but improving.  I have recommended close outpatient follow-up with his PCP.    US  Venous Img Upper Uni Right(DVT) Result Date: 07/13/2024 CLINICAL DATA:  RIGHT arm pain and swelling for 3 weeks EXAM: RIGHT UPPER EXTREMITY VENOUS DOPPLER ULTRASOUND TECHNIQUE: Gray-scale sonography with graded compression, as well as color Doppler and duplex ultrasound were performed to evaluate the upper extremity deep venous system from the level of the subclavian vein and including the jugular, axillary, basilic, radial, ulnar and upper cephalic vein. Spectral Doppler was utilized to evaluate flow at rest and with distal augmentation maneuvers. COMPARISON:  None available FINDINGS: Contralateral Subclavian Vein: Respiratory phasicity is normal and symmetric with the symptomatic side. No evidence of thrombus. Normal compressibility. Internal Jugular Vein: No evidence of thrombus. Normal compressibility, respiratory phasicity and response to augmentation. Subclavian Vein: No evidence of thrombus. Normal compressibility, respiratory phasicity and response to augmentation. Axillary Vein: No evidence of thrombus. Normal compressibility, respiratory phasicity and response to augmentation. Cephalic Vein: No evidence of thrombus. Normal compressibility, respiratory phasicity and response to augmentation. Basilic Vein: No evidence of thrombus. Normal compressibility, respiratory phasicity and response to augmentation. Brachial Veins: No evidence of thrombus. Normal compressibility, respiratory phasicity and response to augmentation. Radial Veins: No evidence of thrombus. Normal compressibility, respiratory phasicity and response to augmentation. Ulnar Veins: No evidence of thrombus. Normal compressibility,  respiratory phasicity and response to augmentation. Other Findings:  None visualized. IMPRESSION: No evidence of DVT within the RIGHT upper extremity. Electronically Signed   By: Aliene Lloyd M.D.   On: 07/13/2024 11:26      Herlinda Milling, PA-C 07/13/24 1151    Freddi Hamilton, MD 07/13/24 1552  "

## 2024-07-20 ENCOUNTER — Encounter: Payer: Self-pay | Admitting: Family Medicine

## 2024-07-20 ENCOUNTER — Ambulatory Visit: Admitting: Family Medicine

## 2024-07-20 VITALS — BP 122/64 | HR 75 | Temp 98.7°F | Ht 67.0 in | Wt 203.0 lb

## 2024-07-20 DIAGNOSIS — M79601 Pain in right arm: Secondary | ICD-10-CM | POA: Diagnosis not present

## 2024-07-20 DIAGNOSIS — R519 Headache, unspecified: Secondary | ICD-10-CM | POA: Diagnosis not present

## 2024-07-20 MED ORDER — TIZANIDINE HCL 4 MG PO TABS: 4.0000 mg | ORAL_TABLET | Freq: Four times a day (QID) | ORAL | 0 refills | Status: AC | PRN
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
